# Patient Record
Sex: Male | Born: 1990 | Race: Black or African American | Hispanic: No | Marital: Single | Smoking: Current some day smoker
Health system: Southern US, Community
[De-identification: ages and names within clinical notes are randomized; demographics above are authoritative.]

## PROBLEM LIST (undated history)

## (undated) DIAGNOSIS — R7303 Prediabetes: Secondary | ICD-10-CM

## (undated) DIAGNOSIS — I10 Essential (primary) hypertension: Secondary | ICD-10-CM

## (undated) HISTORY — PX: APPENDECTOMY: SHX54

---
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
## 2013-04-01 ENCOUNTER — Emergency Department (HOSPITAL_BASED_OUTPATIENT_CLINIC_OR_DEPARTMENT_OTHER): Payer: Medicaid Other

## 2013-04-01 ENCOUNTER — Emergency Department (HOSPITAL_BASED_OUTPATIENT_CLINIC_OR_DEPARTMENT_OTHER)
Admission: EM | Admit: 2013-04-01 | Discharge: 2013-04-01 | Disposition: A | Discharge status: Home / Self Care | Payer: Medicaid Other | Attending: Emergency Medicine | Admitting: Emergency Medicine

## 2013-04-01 DIAGNOSIS — S93402A Sprain of unspecified ligament of left ankle, initial encounter: Secondary | ICD-10-CM

## 2013-04-01 DIAGNOSIS — Y990 Civilian activity done for income or pay: Secondary | ICD-10-CM | POA: Insufficient documentation

## 2013-04-01 DIAGNOSIS — X500XXA Overexertion from strenuous movement or load, initial encounter: Secondary | ICD-10-CM | POA: Insufficient documentation

## 2013-04-01 DIAGNOSIS — S93409A Sprain of unspecified ligament of unspecified ankle, initial encounter: Secondary | ICD-10-CM | POA: Insufficient documentation

## 2013-04-01 DIAGNOSIS — Y9289 Other specified places as the place of occurrence of the external cause: Secondary | ICD-10-CM | POA: Insufficient documentation

## 2013-04-01 DIAGNOSIS — Y939 Activity, unspecified: Secondary | ICD-10-CM | POA: Insufficient documentation

## 2013-04-01 MED ORDER — IBUPROFEN 800 MG PO TABS: 800.0000 mg | ORAL_TABLET | Freq: Three times a day (TID) | ORAL | Status: DC

## 2013-04-01 NOTE — Discharge Instructions (Signed)

## 2013-04-01 NOTE — ED Provider Notes (Signed)
Medical screening examination/treatment/procedure(s) were performed by non-physician practitioner and as supervising physician I was immediately available for consultation/collaboration.   EKG Interpretation None        Brianne Maina T Verlene Glantz, MD 04/01/13 1623 

## 2013-04-01 NOTE — ED Notes (Signed)
States twisted right ankle last week and re aggravated it at work today

## 2013-04-01 NOTE — ED Provider Notes (Signed)
CSN: [id number]     Arrival date & time 04/01/13  1202 History   First MD Initiated Contact with Patient 04/01/13 1240     Chief Complaint  Patient presents with  . Ankle Injury     (Consider location/radiation/quality/duration/timing/severity/associated sxs/prior Treatment) Patient is a 23 y.o. male presenting with ankle pain. The history is provided by the patient. No language interpreter was used.  Ankle Pain Location:  Ankle Time since incident:  1 week Injury: no   Ankle location:  R ankle Pain details:    Quality:  Aching   Radiates to:  Does not radiate   Severity:  Mild   Onset quality:  Gradual   Duration:  1 week   Timing:  Intermittent Chronicity:  New Worsened by:  Nothing tried Ineffective treatments:  None tried Pt turned ankle 1 week ago and then again today.  Pt wearing a brace.    No past medical history on file. Past Surgical History  Procedure Laterality Date  . Appendectomy     No family history on file. History  Substance Use Topics  . Smoking status: Never Smoker   . Smokeless tobacco: Not on file  . Alcohol Use: No    Review of Systems  Musculoskeletal: Positive for joint swelling and myalgias.  All other systems reviewed and are negative.      Allergies  Review of patient's allergies indicates no known allergies.  Home Medications  No current outpatient prescriptions on file. BP 132/81  Pulse 62  Temp(Src) 98.3 F (36.8 C) (Oral)  Resp 18  Ht 5\' 5"  (1.651 m)  Wt 130 lb (58.968 kg)  BMI 21.63 kg/m2  SpO2 99% Physical Exam  Constitutional: He is oriented to person, place, and time. He appears well-developed and well-nourished.  Musculoskeletal: He exhibits tenderness.  Swollen right ankle  Decreased range of motion,  nv and ns intact  Neurological: He is alert and oriented to person, place, and time. He has normal reflexes.  Skin: Skin is warm.  Psychiatric: He has a normal mood and affect.    ED Course  Procedures  (including critical care time) Labs Review Labs Reviewed - No data to display Imaging Review No results found.   EKG Interpretation None      MDM xray no fracture   Final diagnoses:  Sprain of left ankle    Pt in a brace,   Pt advised wear brace,   Rx for ibuprofen,      Elson AreasLeslie K Sharronda Schweers, PA-C 04/01/13 [id number]1418  Braidan Ricciardi K [place], [place] JerseyPA-C 04/01/13 1418

## 2013-04-01 NOTE — ED Notes (Signed)
To clarify : pt states last Thursday he hurt left ankle was seen at [facility] diagnosed with sprain today he has injured right ankle

## 2013-08-08 ENCOUNTER — Encounter (HOSPITAL_BASED_OUTPATIENT_CLINIC_OR_DEPARTMENT_OTHER): Payer: Self-pay | Admitting: Emergency Medicine

## 2013-08-08 ENCOUNTER — Emergency Department (HOSPITAL_BASED_OUTPATIENT_CLINIC_OR_DEPARTMENT_OTHER)
Admission: EM | Admit: 2013-08-08 | Discharge: 2013-08-08 | Disposition: A | Discharge status: Home / Self Care | Payer: Medicaid Other | Attending: Emergency Medicine | Admitting: Emergency Medicine

## 2013-08-08 ENCOUNTER — Emergency Department (HOSPITAL_BASED_OUTPATIENT_CLINIC_OR_DEPARTMENT_OTHER): Payer: Medicaid Other

## 2013-08-08 DIAGNOSIS — R079 Chest pain, unspecified: Secondary | ICD-10-CM

## 2013-08-08 MED ORDER — IBUPROFEN 800 MG PO TABS: 800.0000 mg | ORAL_TABLET | Freq: Three times a day (TID) | ORAL | Status: DC

## 2013-08-08 NOTE — ED Notes (Signed)
Sharp left sided chest pain. Hx of same and was never diagnosed with a cause.

## 2013-08-08 NOTE — Discharge Instructions (Signed)

## 2013-08-08 NOTE — ED Provider Notes (Signed)
CSN: [id number]     Arrival date & time 08/08/13  1349 History   First MD Initiated Contact with Patient 08/08/13 1407     Chief Complaint  Patient presents with  . Chest Pain     (Consider location/radiation/quality/duration/timing/severity/associated sxs/prior Treatment) Patient is a 23 y.o. male presenting with chest pain. The history is provided by the patient. No language interpreter was used.  Chest Pain Pain location:  L chest Pain quality: sharp and stabbing   Pain radiates to:  Does not radiate Pain radiates to the back: no   Pain severity:  No pain Onset quality:  Unable to specify Duration:  1 minute Timing:  Constant Progression:  Worsening Chronicity:  New Context: breathing and movement   Relieved by:  Nothing Worsened by:  Deep breathing Ineffective treatments:  None tried Risk factors: no diabetes mellitus, no high cholesterol and no hypertension     History reviewed. No pertinent past medical history. Past Surgical History  Procedure Laterality Date  . Appendectomy     No family history on file. History  Substance Use Topics  . Smoking status: Never Smoker   . Smokeless tobacco: Not on file  . Alcohol Use: No    Review of Systems  Cardiovascular: Positive for chest pain.  All other systems reviewed and are negative.     Allergies  Review of patient's allergies indicates no known allergies.  Home Medications   Prior to Admission medications   Medication Sig Start Date End Date Taking? Authorizing Provider  ibuprofen (ADVIL,MOTRIN) 800 MG tablet Take 1 tablet (800 mg total) by mouth 3 (three) times daily. 04/01/13   Elson AreasLeslie K Amaryllis Malmquist, PA-C   BP 134/60  Pulse 95  Temp(Src) 98.6 F (37 C) (Oral)  Resp 18  Ht 5\' 5"  (1.651 m)  Wt 130 lb (58.968 kg)  BMI 21.63 kg/m2  SpO2 98% Physical Exam  Nursing note and vitals reviewed. Constitutional: He is oriented to person, place, and time. He appears well-developed and well-nourished.  HENT:  Head:  Normocephalic.  Right Ear: External ear normal.  Left Ear: External ear normal.  Nose: Nose normal.  Mouth/Throat: Oropharynx is clear and moist.  Eyes: Conjunctivae and EOM are normal. Pupils are equal, round, and reactive to light.  Neck: Normal range of motion.  Pulmonary/Chest: Effort normal.  Abdominal: Soft. He exhibits no distension.  Musculoskeletal: Normal range of motion.  Neurological: He is alert and oriented to person, place, and time.  Skin: Skin is warm.  Psychiatric: He has a normal mood and affect.    ED Course  Procedures (including critical care time) Labs Review Labs Reviewed - No data to display  Imaging Review Dg Chest 2 View  08/08/2013   CLINICAL DATA:  Chest pain for 3-4 days.  EXAM: CHEST  2 VIEW  COMPARISON:  PA and lateral chest 12/12/2010.  FINDINGS: Heart size and mediastinal contours are within normal limits. Both lungs are clear. Visualized skeletal structures are unremarkable.  IMPRESSION: Negative exam.   Electronically Signed   By: Drusilla Kannerhomas  Dalessio M.D.   On: 08/08/2013 14:33     EKG Interpretation None      MDM EKG is normal,   Chest xray normal,    I doubt cardiac etiology.   Pt advised ibuprofen as needed.  Resource guide given   Final diagnoses:  Nonspecific chest pain        Elson AreasLeslie K Chidi Shirer, [place]-C 08/08/13 1510

## 2013-08-09 NOTE — ED Provider Notes (Signed)
Medical screening examination/treatment/procedure(s) were conducted as a shared visit with non-physician practitioner(s) or resident  and myself.  I personally evaluated the patient during the encounter and agree with the findings.   I have personally reviewed any xrays and/ or EKG's with the provider and I agree with interpretation.   Healthy male except for cigarette smoking presents with intermittent brief left sharp chest pain without radiation. No pleuritic or exertional component. No syncope with it. No known cardiac problems, no recent blood clot history, no leg swelling or leg pain. Patient has no symptoms currently. well-appearing, regular heart rate and rhythm, lungs clear. Very low-risk chest pain likely musculoskeletal   EKG Interpretation   Date/Time:  Thursday August 08 2013 14:07:43 EDT Ventricular Rate:  70 PR Interval:  122 QRS Duration: 82 QT Interval:  368 QTC Calculation: 397 R Axis:   85 Text Interpretation:  Normal sinus rhythm with sinus arrhythmia Normal ECG  Biphasic T wave V3 Confirmed by Jodi MourningZAVITZ  MD, Markel Mergenthaler ([id number]) on 08/08/2013  3:10:30 PM        Barry SkeensJoshua M Bular Hickok, MD 08/09/13 [id number]

## 2014-06-23 ENCOUNTER — Encounter (HOSPITAL_BASED_OUTPATIENT_CLINIC_OR_DEPARTMENT_OTHER): Payer: Self-pay

## 2014-06-23 ENCOUNTER — Emergency Department (HOSPITAL_BASED_OUTPATIENT_CLINIC_OR_DEPARTMENT_OTHER)
Admission: EM | Admit: 2014-06-23 | Discharge: 2014-06-23 | Disposition: A | Discharge status: Home / Self Care | Payer: Medicaid Other | Attending: Emergency Medicine | Admitting: Emergency Medicine

## 2014-06-23 DIAGNOSIS — M6281 Muscle weakness (generalized): Secondary | ICD-10-CM | POA: Insufficient documentation

## 2014-06-23 DIAGNOSIS — R531 Weakness: Secondary | ICD-10-CM

## 2014-06-23 NOTE — ED Notes (Signed)
C/o "feeling weak" unable to left laundry basket x today-steady fast pace gait into triage-NAD

## 2014-06-23 NOTE — ED Notes (Signed)
C/o weakness and ha x 2 days  Denies n/v,  No cough or congestion

## 2014-06-23 NOTE — Discharge Instructions (Signed)
Fatigue °Fatigue is a feeling of tiredness, lack of energy, lack of motivation, or feeling tired all the time. Having enough rest, good nutrition, and reducing stress will normally reduce fatigue. Consult your caregiver if it persists. The nature of your fatigue will help your caregiver to find out its cause. The treatment is based on the cause.  °CAUSES  °There are many causes for fatigue. Most of the time, fatigue can be traced to one or more of your habits or routines. Most causes fit into one or more of three general areas. They are: °Lifestyle problems °· Sleep disturbances. °· Overwork. °· Physical exertion. °· Unhealthy habits. °· Poor eating habits or eating disorders. °· Alcohol and/or drug use . °· Lack of proper nutrition (malnutrition). °Psychological problems °· Stress and/or anxiety problems. °· Depression. °· Grief. °· Boredom. °Medical Problems or Conditions °· Anemia. °· Pregnancy. °· Thyroid gland problems. °· Recovery from major surgery. °· Continuous pain. °· Emphysema or asthma that is not well controlled °· Allergic conditions. °· Diabetes. °· Infections (such as mononucleosis). °· Obesity. °· Sleep disorders, such as sleep apnea. °· Heart failure or other heart-related problems. °· Cancer. °· Kidney disease. °· Liver disease. °· Effects of certain medicines such as antihistamines, cough and cold remedies, prescription pain medicines, heart and blood pressure medicines, drugs used for treatment of cancer, and some antidepressants. °SYMPTOMS  °The symptoms of fatigue include:  °· Lack of energy. °· Lack of drive (motivation). °· Drowsiness. °· Feeling of indifference to the surroundings. °DIAGNOSIS  °The details of how you feel help guide your caregiver in finding out what is causing the fatigue. You will be asked about your present and past health condition. It is important to review all medicines that you take, including prescription and non-prescription items. A thorough exam will be done.  You will be questioned about your feelings, habits, and normal lifestyle. Your caregiver may suggest blood tests, urine tests, or other tests to look for common medical causes of fatigue.  °TREATMENT  °Fatigue is treated by correcting the underlying cause. For example, if you have continuous pain or depression, treating these causes will improve how you feel. Similarly, adjusting the dose of certain medicines will help in reducing fatigue.  °HOME CARE INSTRUCTIONS  °· Try to get the required amount of good sleep every night. °· Eat a healthy and nutritious diet, and drink enough water throughout the day. °· Practice ways of relaxing (including yoga or meditation). °· Exercise regularly. °· Make plans to change situations that cause stress. Act on those plans so that stresses decrease over time. Keep your work and personal routine reasonable. °· Avoid street drugs and minimize use of alcohol. °· Start taking a daily multivitamin after consulting your caregiver. °SEEK MEDICAL CARE IF:  °· You have persistent tiredness, which cannot be accounted for. °· You have fever. °· You have unintentional weight loss. °· You have headaches. °· You have disturbed sleep throughout the night. °· You are feeling sad. °· You have constipation. °· You have dry skin. °· You have gained weight. °· You are taking any new or different medicines that you suspect are causing fatigue. °· You are unable to sleep at night. °· You develop any unusual swelling of your legs or other parts of your body. °SEEK IMMEDIATE MEDICAL CARE IF:  °· You are feeling confused. °· Your vision is blurred. °· You feel faint or pass out. °· You develop severe headache. °· You develop severe abdominal, pelvic, or   back pain. °· You develop chest pain, shortness of breath, or an irregular or fast heartbeat. °· You are unable to pass a normal amount of urine. °· You develop abnormal bleeding such as bleeding from the rectum or you vomit blood. °· You have thoughts  about harming yourself or committing suicide. °· You are worried that you might harm someone else. °MAKE SURE YOU:  °· Understand these instructions. °· Will watch your condition. °· Will get help right away if you are not doing well or get worse. °Document Released: 11/07/2006 Document Revised: 04/04/2011 Document Reviewed: 05/14/2013 °ExitCare® Patient Information ©2015 [employer]. This information is not intended to replace advice given to you by your health care provider. Make sure you discuss any questions you have with your health care provider. °Weakness °Weakness is a lack of strength. It may be felt all over the body (generalized) or in one specific part of the body (focal). Some causes of weakness can be serious. You may need further medical evaluation, especially if you are elderly or you have a history of immunosuppression (such as chemotherapy or HIV), kidney disease, heart disease, or diabetes. °CAUSES  °Weakness can be caused by many different things, including: °· Infection. °· Physical exhaustion. °· Internal bleeding or other blood loss that results in a lack of red blood cells (anemia). °· Dehydration. This cause is more common in elderly people. °· Side effects or electrolyte abnormalities from medicines, such as pain medicines or sedatives. °· Emotional distress, anxiety, or depression. °· Circulation problems, especially severe peripheral arterial disease. °· Heart disease, such as rapid atrial fibrillation, bradycardia, or heart failure. °· Nervous system disorders, such as Guillain-Barré syndrome, multiple sclerosis, or stroke. °DIAGNOSIS  °To find the cause of your weakness, your caregiver will take your history and perform a physical exam. Lab tests or X-rays may also be ordered, if needed. °TREATMENT  °Treatment of weakness depends on the cause of your symptoms and can vary greatly. °HOME CARE INSTRUCTIONS  °· Rest as needed. °· Eat a well-balanced diet. °· Try to get some exercise  every day. °· Only take over-the-counter or prescription medicines as directed by your caregiver. °SEEK MEDICAL CARE IF:  °· Your weakness seems to be getting worse or spreads to other parts of your body. °· You develop new aches or pains. °SEEK IMMEDIATE MEDICAL CARE IF:  °· You cannot perform your normal daily activities, such as getting dressed and feeding yourself. °· You cannot walk up and down stairs, or you feel exhausted when you do so. °· You have shortness of breath or chest pain. °· You have difficulty moving parts of your body. °· You have weakness in only one area of the body or on only one side of the body. °· You have a fever. °· You have trouble speaking or swallowing. °· You cannot control your bladder or bowel movements. °· You have black or bloody vomit or stools. °MAKE SURE YOU: °· Understand these instructions. °· Will watch your condition. °· Will get help right away if you are not doing well or get worse. °Document Released: 01/10/2005 Document Revised: 07/12/2011 Document Reviewed: 03/11/2011 °ExitCare® Patient Information ©2015 [employer]. This information is not intended to replace advice given to you by your health care provider. Make sure you discuss any questions you have with your health care provider. ° °

## 2014-06-23 NOTE — ED Provider Notes (Signed)
CSN: [id number]     Arrival date & time 06/23/14  1912 History   First MD Initiated Contact with Patient 06/23/14 1922     Chief Complaint  Patient presents with  . Fatigue     (Consider location/radiation/quality/duration/timing/severity/associated sxs/prior Treatment) HPI Comments: 24 year old male complaining of generalized weakness 2 days. States he was trying to lift up a laundry basket today, and was unable to do so with his normal strength which is why he came to the emergency department. States he needs to go to work tomorrow, and at work he lifts heavy boxes, and he does not want to go to work tomorrow if he cannot lift anything. Denies 1 extremity being more week and then another, fever, chills, nausea, vomiting, diarrhea, cough, shortness of breath, wheezing, vision change, dizziness or lightheadedness. Reports a mild, generalized headache. States this has happened in the past and he needed to rest.  The history is provided by the patient.    History reviewed. No pertinent past medical history. Past Surgical History  Procedure Laterality Date  . Appendectomy     No family history on file. History  Substance Use Topics  . Smoking status: Never Smoker   . Smokeless tobacco: Not on file  . Alcohol Use: No    Review of Systems  10 Systems reviewed and are negative for acute change except as noted in the HPI.  Allergies  Review of patient's allergies indicates no known allergies.  Home Medications   Prior to Admission medications   Not on File   BP 118/84 mmHg  Pulse 56  Temp(Src) 97.7 F (36.5 C) (Oral)  Resp 16  Ht  (1.651 m)  Wt 130 lb (58.968 kg)  BMI 21.63 kg/m2  SpO2 99% Physical Exam  Constitutional: He is oriented to person, place, and time. He appears well-developed and well-nourished. No distress.  HENT:  Head: Normocephalic and atraumatic.  Mouth/Throat: Oropharynx is clear and moist.  Eyes: Conjunctivae and EOM are normal. Pupils are  equal, round, and reactive to light.  Neck: Normal range of motion. Neck supple.  Cardiovascular: Normal rate, regular rhythm, normal heart sounds and intact distal pulses.   Pulmonary/Chest: Effort normal and breath sounds normal. No respiratory distress.  Abdominal: Soft. Bowel sounds are normal. There is no tenderness.  Musculoskeletal: Normal range of motion. He exhibits no edema.  Neurological: He is alert and oriented to person, place, and time. He has normal strength. No cranial nerve deficit or sensory deficit. He displays a negative Romberg sign. Coordination normal.  Strength 5/5 BL upper and lower extremities and equal BL. Speech fluent, goal oriented. Moves limbs without ataxia. Normal gait.  Skin: Skin is warm and dry. He is not diaphoretic.  Psychiatric: He has a normal mood and affect. His behavior is normal.  Nursing note and vitals reviewed.   ED Course  Procedures (including critical care time) Labs Review Labs Reviewed - No data to display  Imaging Review No results found.   EKG Interpretation None      MDM   Final diagnoses:  Generalized weakness   Nontoxic appearing, NAD. AF VSS. Strength on my exam 5/5 bilateral upper and lower extremities and equal bilateral. No focal neurologic deficits. Doubt CVA, SAH, ICH. No other symptoms present on any mild headache. I advised him to rest tonight, and I do not have any findings to keep him out of work tomorrow. Stable for discharge. Return precautions given. Patient states understanding of treatment care plan and is  agreeable.  Kathrynn SpeedRobyn M Adelin Ventrella, PA-C 06/23/14 1943  Margarita Grizzleanielle Ray, MD 06/24/14 1256

## 2014-10-16 ENCOUNTER — Emergency Department (HOSPITAL_BASED_OUTPATIENT_CLINIC_OR_DEPARTMENT_OTHER): Payer: Medicaid Other

## 2014-10-16 ENCOUNTER — Emergency Department (HOSPITAL_BASED_OUTPATIENT_CLINIC_OR_DEPARTMENT_OTHER)
Admission: EM | Admit: 2014-10-16 | Discharge: 2014-10-16 | Disposition: A | Discharge status: Home / Self Care | Payer: Medicaid Other | Attending: Emergency Medicine | Admitting: Emergency Medicine

## 2014-10-16 ENCOUNTER — Encounter (HOSPITAL_BASED_OUTPATIENT_CLINIC_OR_DEPARTMENT_OTHER): Payer: Self-pay | Admitting: *Deleted

## 2014-10-16 DIAGNOSIS — R51 Headache: Secondary | ICD-10-CM | POA: Insufficient documentation

## 2014-10-16 DIAGNOSIS — R519 Headache, unspecified: Secondary | ICD-10-CM

## 2014-10-16 MED ORDER — IBUPROFEN 800 MG PO TABS
800.0000 mg | ORAL_TABLET | Freq: Once | ORAL | Status: AC
  Administered 2014-10-16: 800 mg via ORAL
  Filled 2014-10-16: qty 1

## 2014-10-16 NOTE — ED Provider Notes (Signed)
CSN: [id number]     Arrival date & time 10/16/14  1433 History   First MD Initiated Contact with Patient 10/16/14 1453     Chief Complaint  Patient presents with  . Headache     (Consider location/radiation/quality/duration/timing/severity/associated sxs/prior Treatment) HPI Comments: Patient is a 24 year old male with no significant past medical history. He presents for evaluation of headache. He states that he has a dull throbbing pain to the front of his head behind his eyes. His pain is worsened when he moves his eyes. He denies any fevers or chills. He denies any sinus congestion. He denies any injury or trauma.  Patient is a 24 y.o. male presenting with headaches. The history is provided by the patient.  Headache Pain location:  Frontal Quality:  Dull Radiates to:  Does not radiate Onset quality:  Sudden Duration:  2 hours Timing:  Constant Progression:  Unchanged Chronicity:  New Similar to prior headaches: no   Context: not exposure to bright light and not loud noise   Relieved by:  Nothing Worsened by:  Nothing   History reviewed. No pertinent past medical history. Past Surgical History  Procedure Laterality Date  . Appendectomy     No family history on file. Social History  Substance Use Topics  . Smoking status: Never Smoker   . Smokeless tobacco: None  . Alcohol Use: No    Review of Systems  Neurological: Positive for headaches.  All other systems reviewed and are negative.     Allergies  Review of patient's allergies indicates no known allergies.  Home Medications   Prior to Admission medications   Not on File   BP 108/48 mmHg  Pulse 77  Temp(Src) 97.8 F (36.6 C) (Oral)  Resp 18  Ht  (1.676 m)  Wt 130 lb (58.968 kg)  BMI 20.99 kg/m2  SpO2 98% Physical Exam  Constitutional: He is oriented to person, place, and time. He appears well-developed and well-nourished. No distress.  HENT:  Head: Normocephalic and atraumatic.   Mouth/Throat: Oropharynx is clear and moist.  Eyes: EOM are normal. Pupils are equal, round, and reactive to light.  There is no papilledema on funduscopic exam.  Neck: Normal range of motion. Neck supple.  Cardiovascular: Normal rate, regular rhythm and normal heart sounds.   No murmur heard. Pulmonary/Chest: Effort normal and breath sounds normal. No respiratory distress. He has no wheezes.  Musculoskeletal: Normal range of motion. He exhibits no edema.  Lymphadenopathy:    He has no cervical adenopathy.  Neurological: He is alert and oriented to person, place, and time. No cranial nerve deficit. He exhibits normal muscle tone. Coordination normal.  Skin: Skin is warm and dry. He is not diaphoretic.  Nursing note and vitals reviewed.   ED Course  Procedures (including critical care time) Labs Review Labs Reviewed - No data to display  Imaging Review No results found. I have personally reviewed and evaluated these images and lab results as part of my medical decision-making.   EKG Interpretation None      MDM   Final diagnoses:  None    CT scan of the head is unremarkable and neurologic exam is nonfocal. I will recommend Tylenol rotated with Motrin as needed for his headache. I see no indication for LP or further imaging.    Geoffery Lyons, MD 10/16/14 1524

## 2014-10-16 NOTE — ED Notes (Signed)
Headache while at work today. Headache 2 weeks ago that went away after a couple of hours.

## 2014-10-16 NOTE — Discharge Instructions (Signed)
Tylenol 1000 mg rotated with Motrin 600 mg every 4 hours as needed for pain.  Return to the ER symptoms significantly worsen or change.   General Headache Without Cause A general headache is pain or discomfort felt around the head or neck area. The cause may not be found.  HOME CARE   Keep all doctor visits.  Only take medicines as told by your doctor.  Lie down in a dark, quiet room when you have a headache.  Keep a journal to find out if certain things bring on headaches. For example, write down:  What you eat and drink.  How much sleep you get.  Any change to your diet or medicines.  Relax by getting a massage or doing other relaxing activities.  Put ice or heat packs on the head and neck area as told by your doctor.  Lessen stress.  Sit up straight. Do not tighten (tense) your muscles.  Quit smoking if you smoke.  Lessen how much alcohol you drink.  Lessen how much caffeine you drink, or stop drinking caffeine.  Eat and sleep on a regular schedule.  Get 7 to 9 hours of sleep, or as told by your doctor.  Keep lights dim if bright lights bother you or make your headaches worse. GET HELP RIGHT AWAY IF:   Your headache becomes really bad.  You have a fever.  You have a stiff neck.  You have trouble seeing.  Your muscles are weak, or you lose muscle control.  You lose your balance or have trouble walking.  You feel like you will pass out (faint), or you pass out.  You have really bad symptoms that are different than your first symptoms.  You have problems with the medicines given to you by your doctor.  Your medicines do not work.  Your headache feels different than the other headaches.  You feel sick to your stomach (nauseous) or throw up (vomit). MAKE SURE YOU:   Understand these instructions.  Will watch your condition.  Will get help right away if you are not doing well or get worse. Document Released: 10/20/2007 Document Revised:  04/04/2011 Document Reviewed: 12/31/2010 [facility] Patient Information 2015 [place], [place]. This information is not intended to replace advice given to you by your health care provider. Make sure you discuss any questions you have with your health care provider.

## 2014-10-16 NOTE — ED Notes (Addendum)
Pt states he has had a headache since 1pm today. States the headache is "all over". Denies being sensitive to light and sound. States movement hurts. Pt states that this is the 2nd time this headache has happened. Pt states that he "tried to sleep it off." Pt states the last time he had a headache like this was 2 weeks ago.

## 2015-07-29 ENCOUNTER — Emergency Department (HOSPITAL_BASED_OUTPATIENT_CLINIC_OR_DEPARTMENT_OTHER)
Admission: EM | Admit: 2015-07-29 | Discharge: 2015-07-29 | Disposition: A | Discharge status: Home / Self Care | Payer: Self-pay | Attending: Emergency Medicine | Admitting: Emergency Medicine

## 2015-07-29 ENCOUNTER — Encounter (HOSPITAL_BASED_OUTPATIENT_CLINIC_OR_DEPARTMENT_OTHER): Payer: Self-pay

## 2015-07-29 ENCOUNTER — Emergency Department (HOSPITAL_BASED_OUTPATIENT_CLINIC_OR_DEPARTMENT_OTHER): Payer: Self-pay

## 2015-07-29 DIAGNOSIS — F17210 Nicotine dependence, cigarettes, uncomplicated: Secondary | ICD-10-CM | POA: Insufficient documentation

## 2015-07-29 DIAGNOSIS — R0789 Other chest pain: Secondary | ICD-10-CM | POA: Insufficient documentation

## 2015-07-29 HISTORY — DX: Prediabetes: R73.03

## 2015-07-29 LAB — CBC WITH DIFFERENTIAL/PLATELET
BASOS ABS: 0 10*3/uL (ref 0.0–0.1)
BASOS PCT: 0 %
Eosinophils Absolute: 0.1 10*3/uL (ref 0.0–0.7)
Eosinophils Relative: 1 %
HEMATOCRIT: 46.3 % (ref 39.0–52.0)
HEMOGLOBIN: 16.4 g/dL (ref 13.0–17.0)
Lymphocytes Relative: 23 %
Lymphs Abs: 2.6 10*3/uL (ref 0.7–4.0)
MCH: 30.3 pg (ref 26.0–34.0)
MCHC: 35.4 g/dL (ref 30.0–36.0)
MCV: 85.6 fL (ref 78.0–100.0)
MONO ABS: 0.8 10*3/uL (ref 0.1–1.0)
Monocytes Relative: 8 %
NEUTROS ABS: 7.4 10*3/uL (ref 1.7–7.7)
NEUTROS PCT: 68 %
Platelets: 250 10*3/uL (ref 150–400)
RBC: 5.41 MIL/uL (ref 4.22–5.81)
RDW: 12.9 % (ref 11.5–15.5)
WBC: 11 10*3/uL — ABNORMAL HIGH (ref 4.0–10.5)

## 2015-07-29 LAB — BASIC METABOLIC PANEL
ANION GAP: 8 (ref 5–15)
BUN: 13 mg/dL (ref 6–20)
CALCIUM: 9.1 mg/dL (ref 8.9–10.3)
CO2: 24 mmol/L (ref 22–32)
Chloride: 105 mmol/L (ref 101–111)
Creatinine, Ser: 0.95 mg/dL (ref 0.61–1.24)
Glucose, Bld: 102 mg/dL — ABNORMAL HIGH (ref 65–99)
Potassium: 4.4 mmol/L (ref 3.5–5.1)
SODIUM: 137 mmol/L (ref 135–145)

## 2015-07-29 LAB — TROPONIN I

## 2015-07-29 NOTE — Discharge Instructions (Signed)
Your chest x-ray is normal. The remainder of your blood work is unremarkable.  Quit smoking as it may contribute to your pain  Return for worsening symptoms, including difficulty breathing, worsening pain, passing out or any other symptoms concerning to you.  Nonspecific Chest Pain It is often hard to find the cause of chest pain. There is always a chance that your pain could be related to something serious, such as a heart attack or a blood clot in your lungs. Chest pain can also be caused by conditions that are not life-threatening. If you have chest pain, it is very important to follow up with your doctor.  HOME CARE  If you were prescribed an antibiotic medicine, finish it all even if you start to feel better.  Avoid any activities that cause chest pain.  Do not use any tobacco products, including cigarettes, chewing tobacco, or electronic cigarettes. If you need help quitting, ask your doctor.  Do not drink alcohol.  Take medicines only as told by your doctor.  Keep all follow-up visits as told by your doctor. This is important. This includes any further testing if your chest pain does not go away.  Your doctor may tell you to keep your head raised (elevated) while you sleep.  Make lifestyle changes as told by your doctor. These may include:  Getting regular exercise. Ask your doctor to suggest some activities that are safe for you.  Eating a heart-healthy diet. Your doctor or a diet specialist (dietitian) can help you to learn healthy eating options.  Maintaining a healthy weight.  Managing diabetes, if necessary.  Reducing stress. GET HELP IF:  Your chest pain does not go away, even after treatment.  You have a rash with blisters on your chest.  You have a fever. GET HELP RIGHT AWAY IF:  Your chest pain is worse.  You have an increasing cough, or you cough up blood.  You have severe belly (abdominal) pain.  You feel extremely weak.  You pass out  (faint).  You have chills.  You have sudden, unexplained chest discomfort.  You have sudden, unexplained discomfort in your arms, back, neck, or jaw.  You have shortness of breath at any time.  You suddenly start to sweat, or your skin gets clammy.  You feel nauseous.  You vomit.  You suddenly feel light-headed or dizzy.  Your heart begins to beat quickly, or it feels like it is skipping beats. These symptoms may be an emergency. Do not wait to see if the symptoms will go away. Get medical help right away. Call your local emergency services (911 in the [place] Do not drive yourself to the hospital.   This information is not intended to replace advice given to you by your health care provider. Make sure you discuss any questions you have with your health care provider.   Document Released: 06/29/2007 Document Revised: 01/31/2014 Document Reviewed: 08/16/2013 Elsevier Interactive Patient Education [employer]2016 [employer].

## 2015-07-29 NOTE — ED Provider Notes (Signed)
CSN: [id number]     Arrival date & time 07/29/15  1141 History   First MD Initiated Contact with Patient 07/29/15 1156     Chief Complaint  Patient presents with  . Chest Pain     (Consider location/radiation/quality/duration/timing/severity/associated sxs/prior Treatment) HPI  25 year old who presents with chest pain. States 2-3 days with on and off chest pain on his left chest. States that he has had chest pains before, but they improved for a while and now recurrent with frequent episodes. States he has a few seconds to minutes of sharp pain over the left side. Smoked black and mild 2 days ago, preceding pain but unsure if that caused it. No aggravating or alleviating factors. No factors that bring it on such a eating, activity or exertion or movement. Does think symptoms are worse when he takes a deep breath in. Does not feel dyspnea, lightheadedness or syncope. No LE edema or calf tenderness. No personal history of PE/DVT or family history. No recent surgery. NO recent fevers, chills, cough, cold or flu symptoms.   Past Medical History  Diagnosis Date  . Prediabetes    Past Surgical History  Procedure Laterality Date  . Appendectomy     No family history on file. Social History  Substance Use Topics  . Smoking status: Current Some Day Smoker    Types: Cigars  . Smokeless tobacco: None  . Alcohol Use: No    Review of Systems 10/14 systems reviewed and are negative other than those stated in the HPI   Allergies  Review of patient's allergies indicates no known allergies.  Home Medications   Prior to Admission medications   Not on File   BP 138/81 mmHg  Pulse 60  Temp(Src) 98.6 F (37 C) (Oral)  Resp 18  Ht 5\' 5"  (1.651 m)  Wt 135 lb (61.236 kg)  BMI 22.47 kg/m2  SpO2 100% Physical Exam Physical Exam  Nursing note and vitals reviewed. Constitutional: Well developed, well nourished, non-toxic, and in no acute distress Head: Normocephalic and atraumatic.   Mouth/Throat: Oropharynx is clear and moist.  Neck: Normal range of motion. Neck supple.  Cardiovascular: Normal rate and regular rhythm.  No LE edema or calf tenderness.  Pulmonary/Chest: Effort normal and breath sounds normal.  Abdominal: Soft. There is no tenderness. There is no rebound and no guarding.  Musculoskeletal: Normal range of motion.  Neurological: Alert, no facial droop, fluent speech, moves all extremities symmetrically Skin: Skin is warm and dry.  Psychiatric: Cooperative  ED Course  Procedures (including critical care time) Labs Review Labs Reviewed  CBC WITH DIFFERENTIAL/PLATELET - Abnormal; Notable for the following:    WBC 11.0 (*)    All other components within normal limits  BASIC METABOLIC PANEL - Abnormal; Notable for the following:    Glucose, Bld 102 (*)    All other components within normal limits  TROPONIN I    Imaging Review Dg Chest 2 View  07/29/2015  CLINICAL DATA:  Chest pain EXAM: CHEST  2 VIEW COMPARISON:  08/08/2013 FINDINGS: Normal heart size and mediastinal contours. No acute infiltrate or edema. No effusion or pneumothorax. Mild upper thoracic levo curvature. No acute osseous findings. IMPRESSION: No active cardiopulmonary disease. Electronically Signed   By: Marnee SpringJonathon  Watts M.D.   On: 07/29/2015 13:06   I have personally reviewed and evaluated these images and lab results as part of my medical decision-making.   EKG Interpretation   Date/Time:  Wednesday July 29 2015 11:54:37 EDT  Ventricular Rate:  54 PR Interval:    QRS Duration: 92 QT Interval:  416 QTC Calculation: 395 R Axis:   94 Text Interpretation:  Sinus rhythm Borderline right axis deviation ST  elev, probable normal early repol pattern Benign early repolarization No  significant change since last tracing Confirmed by Jeryl Umholtz MD, Yaire Kreher ([id number]) on  07/29/2015 11:59:48 AM      MDM   Final diagnoses:  Atypical chest pain   Presenting with 2-3 days of intermittent atypical  chest pain. Well-appearing and in no acute distress. Vital signs normal. He is asymptomatic. EKG with benign early repolarization but no other signs of heart strain or ischemia. Chest x-ray showing no cardiopulmonary processes. Basic blood work unremarkable.  PERC negative and low suspicion for PE. History not concerning for ACS and he is young and of low risk. No concern for PE or other serious intrathoracic or cardiopulmonary processes. Discussed smoking cessation as it can be contributing to symptoms. Strict return and follow-up instructions reviewed. He expressed understanding of all discharge instructions and felt comfortable with the plan of care.     Lavera Guiseana Duo Otila Starn, MD 07/29/15 [phone]

## 2015-07-29 NOTE — ED Notes (Signed)
CP x 2 days 

## 2015-09-01 ENCOUNTER — Encounter (HOSPITAL_BASED_OUTPATIENT_CLINIC_OR_DEPARTMENT_OTHER): Payer: Self-pay | Admitting: Emergency Medicine

## 2015-09-01 ENCOUNTER — Emergency Department (HOSPITAL_BASED_OUTPATIENT_CLINIC_OR_DEPARTMENT_OTHER)
Admission: EM | Admit: 2015-09-01 | Discharge: 2015-09-01 | Disposition: A | Discharge status: Home / Self Care | Payer: Medicaid Other | Attending: Emergency Medicine | Admitting: Emergency Medicine

## 2015-09-01 DIAGNOSIS — Y9361 Activity, american tackle football: Secondary | ICD-10-CM | POA: Insufficient documentation

## 2015-09-01 DIAGNOSIS — Y929 Unspecified place or not applicable: Secondary | ICD-10-CM | POA: Insufficient documentation

## 2015-09-01 DIAGNOSIS — S29012A Strain of muscle and tendon of back wall of thorax, initial encounter: Secondary | ICD-10-CM

## 2015-09-01 DIAGNOSIS — S39012A Strain of muscle, fascia and tendon of lower back, initial encounter: Secondary | ICD-10-CM | POA: Insufficient documentation

## 2015-09-01 DIAGNOSIS — W2101XA Struck by football, initial encounter: Secondary | ICD-10-CM | POA: Insufficient documentation

## 2015-09-01 DIAGNOSIS — Y990 Civilian activity done for income or pay: Secondary | ICD-10-CM | POA: Insufficient documentation

## 2015-09-01 DIAGNOSIS — Z87891 Personal history of nicotine dependence: Secondary | ICD-10-CM | POA: Insufficient documentation

## 2015-09-01 MED ORDER — NAPROXEN 500 MG PO TABS: 500.0000 mg | ORAL_TABLET | Freq: Two times a day (BID) | ORAL | 0 refills | Status: DC

## 2015-09-01 NOTE — ED Triage Notes (Signed)
Pt having right upper back/shoulder pain since Sunday after playing football.  Took ibuprofen yesterday with no relief.

## 2015-09-01 NOTE — ED Provider Notes (Signed)
[facility] Provider Note   CSN: [id number] Arrival date & time: 09/01/15  5621  First Provider Contact:  First MD Initiated Contact with Patient 09/01/15 1017        History   Chief Complaint Chief Complaint  Patient presents with  . Back Pain    HPI Barry Lester is a 25 y.o. male who presents with complaint of pain in the right shoulder girdle. Patient states that he was playing football 2 days ago, he fell a couple times during the game but then began having pain in the musculature of his back and shoulder region. He denies joint pain, but states he has worse pain with lifting his arms up over his head, putting on her taking off his shirt. He states that he went to work yesterday and took an 800 mg ibuprofen, which helped. Today he took the medications in his job was too much for him. 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 He left work because of pain. He denies numbness, tingling, weakness, history of injury to the same area.  HPI  Past Medical History:  Diagnosis Date  . Prediabetes     There are no active problems to display for this patient.   Past Surgical History:  Procedure Laterality Date  . APPENDECTOMY         Home Medications    Prior to Admission medications   Medication Sig Start Date End Date Taking? Authorizing Provider  naproxen (NAPROSYN) 500 MG tablet Take 1 tablet (500 mg total) by mouth 2 (two) times daily with a meal. 09/01/15   Arthor Captain, PA-C    Family History No family history on file.  Social History Social History  Substance Use Topics  . Smoking status: Former Smoker    Types: Cigars  . Smokeless tobacco: Never Used  . Alcohol use No     Allergies   Review of patient's allergies indicates no known allergies.   Review of Systems Review of Systems  Constitutional: Negative for chills and fever.  Musculoskeletal: Positive for myalgias. Negative for joint swelling.    Neurological: Negative for weakness.     Physical Exam Updated Vital Signs BP 117/63 (BP Location: Left Arm)   Pulse (!) 53   Temp 97.8 F (36.6 C) (Oral)   Resp 16   Ht  (1.676 m)   Wt 59 kg   SpO2 100%   BMI 20.98 kg/m   Physical Exam  Constitutional: He appears well-developed and well-nourished. No distress.  HENT:  Head: Normocephalic and atraumatic.  Eyes: Conjunctivae are normal. No scleral icterus.  Neck: Normal range of motion. Neck supple.  Cardiovascular: Normal rate, regular rhythm and normal heart sounds.   Pulmonary/Chest: Effort normal and breath sounds normal. No respiratory distress.  Abdominal: Soft. There is no tenderness.  Musculoskeletal: He exhibits no edema.       Right shoulder: He exhibits normal range of motion, no bony tenderness, no crepitus, no deformity and no spasm.       Thoracic back: He exhibits tenderness. He exhibits no bony tenderness, no edema, no deformity and no spasm.       Back:  Neurological: He is alert.  Skin: Skin is warm and dry. He is not diaphoretic.  Psychiatric: His behavior is normal.  Nursing note and vitals reviewed.    ED Treatments / Results  Labs (all labs ordered are listed, but only abnormal results are displayed) Labs Reviewed -  No data to display  EKG  EKG Interpretation None       Radiology No results found.  Procedures Procedures (including critical care time)  Medications Ordered in ED Medications - No data to display   Initial Impression / Assessment and Plan / ED Course  I have reviewed the triage vital signs and the nursing notes.  Pertinent labs & imaging results that were available during my care of the patient were reviewed by me and considered in my medical decision making (see chart for details).  Clinical Course    Patient with apparent muscle strain injury of the R lattisimus dorsi and R teres.  D/c with naproxen and cryotherapy. No concern for joint injury.  Discussed   Return precautions.  Final Clinical Impressions(s) / ED Diagnoses   Final diagnoses:  Strain of latissimus dorsi muscle, initial encounter    New Prescriptions New Prescriptions   NAPROXEN (NAPROSYN) 500 MG TABLET    Take 1 tablet (500 mg total) by mouth 2 (two) times daily with a meal.     Arthor Captainbigail Sofia Vanmeter, PA-C 09/01/15 1034    Charlynne Panderavid Hsienta Yao, MD 09/01/15 1517

## 2017-04-20 ENCOUNTER — Emergency Department (HOSPITAL_BASED_OUTPATIENT_CLINIC_OR_DEPARTMENT_OTHER)
Admission: EM | Admit: 2017-04-20 | Discharge: 2017-04-20 | Disposition: A | Discharge status: Home / Self Care | Payer: Self-pay | Attending: Emergency Medicine | Admitting: Emergency Medicine

## 2017-04-20 ENCOUNTER — Encounter (HOSPITAL_BASED_OUTPATIENT_CLINIC_OR_DEPARTMENT_OTHER): Payer: Self-pay | Admitting: *Deleted

## 2017-04-20 ENCOUNTER — Other Ambulatory Visit: Payer: Self-pay

## 2017-04-20 DIAGNOSIS — R220 Localized swelling, mass and lump, head: Secondary | ICD-10-CM | POA: Insufficient documentation

## 2017-04-20 DIAGNOSIS — Z87891 Personal history of nicotine dependence: Secondary | ICD-10-CM | POA: Insufficient documentation

## 2017-04-20 LAB — RAPID STREP SCREEN (MED CTR MEBANE ONLY): Streptococcus, Group A Screen (Direct): NEGATIVE

## 2017-04-20 NOTE — Discharge Instructions (Signed)
It is unclear what caused your symptoms.  You test is negative for strep pharyngitis.  It could be initial sign of viral respiratory tract infection (common cold).  Please come back to ED if your symptoms recurs or if you have other symptoms concerning to you.

## 2017-04-20 NOTE — ED Provider Notes (Signed)
[facility] EMERGENCY DEPARTMENT Provider Note   CSN: [id number] Arrival date & time: 04/20/17  1556     History   Chief Complaint Chief Complaint  Patient presents with  . Oral Swelling    HPI Barry Lester is a 27 y.o. male with no significant past medical history who presents with right-sided tongue swelling and discomfort with swallowing about 4 hours ago.  Patient was brushing his teeth when he felt right-sided tongue swelling and discomfort with swallowing.  Denies pain.  Denies recent illness.  Denies fevers or chills. He denies cough,  chest pain, dyspnea, reflux or heartburn. Currently his symptoms have resolved.  Denies new medicine or new food.  Denies smoking cigarettes, drinking alcohol or recreational drug use. HPI  Past Medical History:  Diagnosis Date  . Prediabetes     There are no active problems to display for this patient.   Past Surgical History:  Procedure Laterality Date  . APPENDECTOMY          Home Medications    Prior to Admission medications   Medication Sig Start Date End Date Taking? Authorizing Provider  naproxen (NAPROSYN) 500 MG tablet Take 1 tablet (500 mg total) by mouth 2 (two) times daily with a meal. 09/01/15   Arthor Captain, PA-C    Family History History reviewed. No pertinent family history.  Social History Social History   Tobacco Use  . Smoking status: Former Smoker    Types: Cigars  . Smokeless tobacco: Never Used  Substance Use Topics  . Alcohol use: No  . Drug use: No     Allergies   Patient has no known allergies.   Review of Systems Review of Systems  Constitutional: Negative for chills and fever.  HENT: Negative for congestion, ear pain, rhinorrhea, sore throat and trouble swallowing.   Eyes: Negative for pain and visual disturbance.  Respiratory: Negative for cough, choking, shortness of breath, wheezing and stridor.   Cardiovascular: Negative for chest pain and palpitations.    Gastrointestinal: Negative for abdominal pain, blood in stool, diarrhea, nausea and vomiting.  Genitourinary: Negative for dysuria and hematuria.  Musculoskeletal: Negative for arthralgias, back pain and joint swelling.  Skin: Negative for color change and rash.  Neurological: Negative for headaches.  Psychiatric/Behavioral: Negative for confusion.  All other systems reviewed and are negative.  Physical Exam Updated Vital Signs BP (!) 147/96 (BP Location: Right Arm)   Pulse 63   Temp 98.3 F (36.8 C) (Oral)   Resp 20   Ht  (1.676 m)   Wt 61.1 kg (134 lb 12.8 oz)   SpO2 100%   BMI 21.76 kg/m   Physical Exam GEN: appears well and comfortable. No apparent distress. Head: normocephalic and atraumatic  Eyes: conjunctiva without injection. Sclera anicteric. PERRLA. EOMI Ears: external ear, ear canal and TM normal Nares: no rhinorrhea.  No swollen turbinates.  No erythema of nasal mucosa Oropharynx: MMM.  No tongue swelling or lesion.  No erythema.  No exudation or petechiae.  Uvula midline.  HEM: negative for cervical or periauricular lymphadenopathies CVS: RRR, nl s1 & s2, no murmurs, no edema,  2+ DP pulses bilaterally, cap refills brisk RESP: no IWOB, good air movement bilaterally, CTAB GI: BS present & normal, soft, NTND, no guarding, no rebound, no mass GU: no suprapubic or CVA tenderness MSK: no focal tenderness or notable swelling SKIN: no apparent skin lesion  ENDO/EXO: negative thyromegaly.  No salivary gland tenderness or swelling NEURO: alert and oiented appropriately,  no gross deficits   PSYCH: euthymic mood with congruent affect   ED Treatments / Results  Labs (all labs ordered are listed, but only abnormal results are displayed) Labs Reviewed  RAPID STREP SCREEN (NOT AT [facility])  CULTURE, GROUP A STREP [facility])    EKG None  Radiology No results found.  Procedures Procedures (including critical care time)  Medications Ordered in ED Medications - No  data to display   Initial Impression / Assessment and Plan / ED Course  I have reviewed the triage vital signs and the nursing notes.  Pertinent labs & imaging results that were available during my care of the patient were reviewed by me and considered in my medical decision making (see chart for details).  27 year old male with no significant past medical history who presents with an episode of tongue swelling and discomfort with swallowing.  No recent illness, new medication or food.  His symptoms have completely resolved.  Physical exam within normal limits.  Rapid strep negative.  Discharge home.  Return precautions discussed.  Final Clinical Impressions(s) / ED Diagnoses   Final diagnoses:  Tongue swelling    ED Discharge Orders    None       Almon Hercules, MD 04/20/17 1647    Maia Plan, MD 04/20/17 1730

## 2017-04-20 NOTE — ED Triage Notes (Signed)
Pt stated that his tongue looks weird, and that he feels swelling on one side of tongue and mouth.

## 2017-04-23 LAB — CULTURE, GROUP A STREP (THRC)

## 2017-09-11 ENCOUNTER — Emergency Department (HOSPITAL_BASED_OUTPATIENT_CLINIC_OR_DEPARTMENT_OTHER)
Admission: EM | Admit: 2017-09-11 | Discharge: 2017-09-11 | Disposition: A | Discharge status: Home / Self Care | Payer: PRIVATE HEALTH INSURANCE | Attending: Emergency Medicine | Admitting: Emergency Medicine

## 2017-09-11 ENCOUNTER — Encounter (HOSPITAL_BASED_OUTPATIENT_CLINIC_OR_DEPARTMENT_OTHER): Payer: Self-pay

## 2017-09-11 ENCOUNTER — Other Ambulatory Visit: Payer: Self-pay

## 2017-09-11 DIAGNOSIS — R222 Localized swelling, mass and lump, trunk: Secondary | ICD-10-CM | POA: Insufficient documentation

## 2017-09-11 DIAGNOSIS — Z5321 Procedure and treatment not carried out due to patient leaving prior to being seen by health care provider: Secondary | ICD-10-CM | POA: Diagnosis not present

## 2017-09-11 NOTE — ED Triage Notes (Signed)
C/o "a knot" to left mid back-first noticed today-NAD-steady gait

## 2017-09-12 NOTE — ED Notes (Signed)
Follow up call made  No answer  09/12/17 1236  s Jamarri Vuncannon rn

## 2018-09-30 ENCOUNTER — Encounter (HOSPITAL_BASED_OUTPATIENT_CLINIC_OR_DEPARTMENT_OTHER): Payer: Self-pay | Admitting: *Deleted

## 2018-09-30 ENCOUNTER — Other Ambulatory Visit: Payer: Self-pay

## 2018-09-30 ENCOUNTER — Emergency Department (HOSPITAL_BASED_OUTPATIENT_CLINIC_OR_DEPARTMENT_OTHER)
Admission: EM | Admit: 2018-09-30 | Discharge: 2018-09-30 | Disposition: A | Discharge status: Home / Self Care | Payer: PRIVATE HEALTH INSURANCE | Attending: Emergency Medicine | Admitting: Emergency Medicine

## 2018-09-30 DIAGNOSIS — M542 Cervicalgia: Secondary | ICD-10-CM | POA: Insufficient documentation

## 2018-09-30 DIAGNOSIS — R7303 Prediabetes: Secondary | ICD-10-CM | POA: Insufficient documentation

## 2018-09-30 DIAGNOSIS — G8929 Other chronic pain: Secondary | ICD-10-CM | POA: Insufficient documentation

## 2018-09-30 DIAGNOSIS — Z87891 Personal history of nicotine dependence: Secondary | ICD-10-CM | POA: Insufficient documentation

## 2018-09-30 MED ORDER — ACETAMINOPHEN 500 MG PO TABS
1000.0000 mg | ORAL_TABLET | Freq: Once | ORAL | Status: AC
  Administered 2018-09-30: 1000 mg via ORAL
  Filled 2018-09-30: qty 2

## 2018-09-30 MED ORDER — NAPROXEN 500 MG PO TABS: 500.0000 mg | ORAL_TABLET | Freq: Two times a day (BID) | ORAL | 0 refills | Status: DC

## 2018-09-30 MED ORDER — NAPROXEN 250 MG PO TABS
500.0000 mg | ORAL_TABLET | Freq: Once | ORAL | Status: AC
  Administered 2018-09-30: 500 mg via ORAL
  Filled 2018-09-30: qty 2

## 2018-09-30 MED ORDER — METHOCARBAMOL 500 MG PO TABS
1000.0000 mg | ORAL_TABLET | Freq: Once | ORAL | Status: AC
  Administered 2018-09-30: 1000 mg via ORAL
  Filled 2018-09-30: qty 2

## 2018-09-30 NOTE — ED Notes (Signed)
Pt reports neck "irriatation" for months. States no change in symptoms prompting his visit to ED. States he doesn't like physical therapy because "they make me stretch and I don't have time for that." Pt states that he has been watching facebook videos of "professionals cracking people's necks." States "that's what I need." Informed patient that no one in the emergency department would be cracking his neck tonight.

## 2018-09-30 NOTE — ED Provider Notes (Signed)
[facility] Provider Note   CSN: [id number] Arrival date & time: 09/30/18  2223     History   Chief Complaint Chief Complaint  Patient presents with  . Neck Pain    HPI Barry Lester is a 28 y.o. male.     The history is provided by the patient.  Illness Location:  Neck Quality:  Needs to crack Severity:  Moderate Onset quality:  Gradual Timing:  Constant Progression:  Unchanged Chronicity:  Chronic Context:  Has been going on for a while.  No trauma Relieved by:  Nothing Worsened by:  Nothing Ineffective treatments:  None tried Associated symptoms: no abdominal pain, no chest pain, no congestion, no cough, no diarrhea, no ear pain, no fatigue, no fever, no headaches, no loss of consciousness, no myalgias, no nausea, no rash, no rhinorrhea, no shortness of breath, no sore throat, no vomiting and no wheezing   Risk factors:  None No headache no weakness no numbness.    Past Medical History:  Diagnosis Date  . Prediabetes     There are no active problems to display for this patient.   Past Surgical History:  Procedure Laterality Date  . APPENDECTOMY          Home Medications    Prior to Admission medications   Medication Sig Start Date End Date Taking? Authorizing Provider  naproxen (NAPROSYN) 500 MG tablet Take 1 tablet (500 mg total) by mouth 2 (two) times daily with a meal. 09/01/15   Margarita Mail, PA-C    Family History No family history on file.  Social History Social History   Tobacco Use  . Smoking status: Former Research scientist (life sciences)  . Smokeless tobacco: Never Used  Substance Use Topics  . Alcohol use: No  . Drug use: No     Allergies   Patient has no known allergies.   Review of Systems Review of Systems  Constitutional: Negative for fatigue and fever.  HENT: Negative for congestion, ear pain, rhinorrhea and sore throat.   Respiratory: Negative for cough, shortness of breath and wheezing.   Cardiovascular:  Negative for chest pain.  Gastrointestinal: Negative for abdominal pain, diarrhea, nausea and vomiting.  Genitourinary: Negative for difficulty urinating.  Musculoskeletal: Positive for neck pain. Negative for myalgias and neck stiffness.  Skin: Negative for rash.  Neurological: Negative for loss of consciousness and headaches.  Psychiatric/Behavioral: Negative for agitation.  All other systems reviewed and are negative.    Physical Exam Updated Vital Signs BP 124/75 (BP Location: Left Arm)   Pulse (!) 53   Temp 98.2 F (36.8 C) (Oral)   Resp 18   Ht 5\' 6"  (1.676 m)   Wt 59.9 kg   SpO2 100%   BMI 21.31 kg/m   Physical Exam Vitals signs and nursing note reviewed.  Constitutional:      General: He is not in acute distress.    Appearance: He is normal weight.  HENT:     Head: Normocephalic and atraumatic.     Nose: Nose normal.  Eyes:     Extraocular Movements: Extraocular movements intact.     Conjunctiva/sclera: Conjunctivae normal.     Pupils: Pupils are equal, round, and reactive to light.  Neck:     Musculoskeletal: Normal range of motion and neck supple. No neck rigidity or muscular tenderness.  Cardiovascular:     Rate and Rhythm: Normal rate and regular rhythm.     Pulses: Normal pulses.     Heart sounds: Normal  heart sounds.  Pulmonary:     Effort: Pulmonary effort is normal.     Breath sounds: Normal breath sounds.  Abdominal:     General: Abdomen is flat. Bowel sounds are normal.     Tenderness: There is no abdominal tenderness.  Musculoskeletal: Normal range of motion.  Lymphadenopathy:     Cervical: No cervical adenopathy.  Skin:    General: Skin is warm and dry.     Capillary Refill: Capillary refill takes less than 2 seconds.  Neurological:     General: No focal deficit present.     Mental Status: He is alert and oriented to person, place, and time.  Psychiatric:        Mood and Affect: Mood normal.        Behavior: Behavior normal.      ED  Treatments / Results  Labs (all labs ordered are listed, but only abnormal results are displayed) Labs Reviewed - No data to display  EKG None  Radiology No results found.  Procedures Procedures (including critical care time)  Medications Ordered in ED Medications  naproxen (NAPROSYN) tablet 500 mg (has no administration in time range)  acetaminophen (TYLENOL) tablet 1,000 mg (has no administration in time range)  methocarbamol (ROBAXIN) tablet 1,000 mg (has no administration in time range)      Chronic pain,  No LAN.  MSK pain.  NSAIDs and heat  Final Clinical Impressions(s) / ED Diagnoses   Return for intractable cough, coughing up blood,fevers >100.4 unrelieved by medication, shortness of breath, intractable vomiting, chest pain, shortness of breath, weakness,numbness, changes in speech, facial asymmetry,abdominal pain, passing out,Inability to tolerate liquids or food, cough, altered mental status or any concerns. No signs of systemic illness or infection. The patient is nontoxic-appearing on exam and vital signs are within normal limits.   I have reviewed the triage vital signs and the nursing notes. Pertinent labs &imaging results that were available during my care of the patient were reviewed by me and considered in my medical decision making (see chart for details).             After history, exam, and medical workup I feel the patient has been            appropriately medically screened and is safe for discharge home.          Pertinent diagnoses were discussed with the patient. Patient was       given    return precautions    Lizet Kelso, MD 09/30/18 2325

## 2018-09-30 NOTE — ED Triage Notes (Addendum)
Pt reports chronic neck pain. He's been seeing physical therapy for same. States he wants someone to pop his neck. States it feels more "irritated" this evening

## 2018-11-01 ENCOUNTER — Emergency Department (HOSPITAL_BASED_OUTPATIENT_CLINIC_OR_DEPARTMENT_OTHER)
Admission: EM | Admit: 2018-11-01 | Discharge: 2018-11-01 | Disposition: A | Discharge status: Home / Self Care | Payer: BC Managed Care – PPO | Attending: Emergency Medicine | Admitting: Emergency Medicine

## 2018-11-01 ENCOUNTER — Other Ambulatory Visit: Payer: Self-pay

## 2018-11-01 ENCOUNTER — Encounter (HOSPITAL_BASED_OUTPATIENT_CLINIC_OR_DEPARTMENT_OTHER): Payer: Self-pay | Admitting: Emergency Medicine

## 2018-11-01 DIAGNOSIS — Z87891 Personal history of nicotine dependence: Secondary | ICD-10-CM | POA: Insufficient documentation

## 2018-11-01 DIAGNOSIS — Z79899 Other long term (current) drug therapy: Secondary | ICD-10-CM | POA: Diagnosis not present

## 2018-11-01 DIAGNOSIS — M545 Low back pain, unspecified: Secondary | ICD-10-CM

## 2018-11-01 DIAGNOSIS — G8929 Other chronic pain: Secondary | ICD-10-CM | POA: Insufficient documentation

## 2018-11-01 MED ORDER — METHOCARBAMOL 500 MG PO TABS: 500.0000 mg | ORAL_TABLET | Freq: Every evening | ORAL | 0 refills | Status: AC | PRN

## 2018-11-01 NOTE — Discharge Instructions (Signed)
You were given a prescription for Robaxin which is a muscle relaxer.  You should not drive, work, or operate machinery while taking this medication as it can make you very drowsy.    Please follow up with your primary care provider within 5-7 days for re-evaluation of your symptoms. If you do not have a primary care provider, information for a healthcare clinic has been provided for you to make arrangements for follow up care.   Return to the emergency department immediately if you experience any back pain associated with fevers, loss of control of your bowels/bladder, weakness/numbness to your legs, numbness to your groin area, inability to walk, or inability to urinate.    

## 2018-11-01 NOTE — ED Triage Notes (Signed)
Chronic back pain in lower lumbar/sacral region going on "for a while" States feels like "jamming." seen in ED and PCP for same, was taking naproxen and laxatives with no relief. No tingling or numbness in legs, denies incontinence of bowel and bladder.

## 2018-11-01 NOTE — ED Provider Notes (Signed)
[facility] EMERGENCY DEPARTMENT Provider Note   CSN: [id number] Arrival date & time: 11/01/18  2009     History   Chief Complaint Chief Complaint  Patient presents with  . Back Pain    HPI Barry Lester is a 28 y.o. male.     HPI   Patient is a 28 year old male with history prediabetes who presents emergency department today complaining of lower back pain that is been ongoing for the last few months.  He denies any exacerbating injuries.  He states that it feels like he needs to pop/crack his back but he cannot.  He is tried naproxen without relief.  He denies any numbness/weakness of the legs.  No fevers.  No history of IV drug use or cancer.  No urinary retention or loss control of bowel or bladder function.  Past Medical History:  Diagnosis Date  . Prediabetes     There are no active problems to display for this patient.   Past Surgical History:  Procedure Laterality Date  . APPENDECTOMY          Home Medications    Prior to Admission medications   Medication Sig Start Date End Date Taking? Authorizing Provider  methocarbamol (ROBAXIN) 500 MG tablet Take 1 tablet (500 mg total) by mouth at bedtime as needed for up to 5 days for muscle spasms. 11/01/18 11/06/18  Kennedy Bohanon S, PA-C  naproxen (NAPROSYN) 500 MG tablet Take 1 tablet (500 mg total) by mouth 2 (two) times daily with a meal. 09/01/15   Arthor Captain, PA-C  naproxen (NAPROSYN) 500 MG tablet Take 1 tablet (500 mg total) by mouth 2 (two) times daily with a meal. 09/30/18   Palumbo, April, MD    Family History History reviewed. No pertinent family history.  Social History Social History   Tobacco Use  . Smoking status: Former Games developer  . Smokeless tobacco: Never Used  Substance Use Topics  . Alcohol use: No  . Drug use: No     Allergies   Patient has no known allergies.   Review of Systems Review of Systems  Gastrointestinal: Negative for abdominal pain, constipation, diarrhea,  nausea and vomiting.       No loss of control of bowel function.  Genitourinary: Negative for dysuria.       No loss of control of bladder function or urinary retention  Musculoskeletal: Positive for back pain.  Neurological: Negative for weakness and numbness.    Physical Exam Updated Vital Signs BP 138/79 (BP Location: Right Arm)   Pulse 64   Temp 98.4 F (36.9 C) (Oral)   Resp 14   Ht 5\' 6"  (1.676 m)   Wt 63 kg   SpO2 99%   BMI 22.44 kg/m   Physical Exam Constitutional:      General: He is not in acute distress.    Appearance: He is well-developed.  Eyes:     Conjunctiva/sclera: Conjunctivae normal.  Cardiovascular:     Rate and Rhythm: Normal rate.  Pulmonary:     Effort: Pulmonary effort is normal.  Musculoskeletal:     Comments: No midline lumbar tenderness.  There is TTP to the left lumbar paraspinous muscles that reproduces his pain.  5/5 strength to the bilateral lower extremities.  Normal sensation throughout.  Skin:    General: Skin is warm and dry.  Neurological:     Mental Status: He is alert and oriented to person, place, and time.      ED Treatments /  Results  Labs (all labs ordered are listed, but only abnormal results are displayed) Labs Reviewed - No data to display  EKG None  Radiology No results found.  Procedures Procedures (including critical care time)  Medications Ordered in ED Medications - No data to display   Initial Impression / Assessment and Plan / ED Course  I have reviewed the triage vital signs and the nursing notes.  Pertinent labs & imaging results that were available during my care of the patient were reviewed by me and considered in my medical decision making (see chart for details).     Final Clinical Impressions(s) / ED Diagnoses   Final diagnoses:  Chronic left-sided low back pain without sciatica   Patient with back pain.  No neurological deficits and normal neuro exam.  Patient can walk but states is  painful.  No loss of bowel or bladder control.  No concern for cauda equina.  No fever, night sweats, weight loss, h/o cancer, IVDU.  RICE protocol and pain medicine indicated and discussed with patient.   ED Discharge Orders         Ordered    methocarbamol (ROBAXIN) 500 MG tablet  At bedtime PRN     11/01/18 2114           Rodney Booze, PA-C 11/01/18 2114    Hayden Rasmussen, MD 11/01/18 2216

## 2019-02-05 ENCOUNTER — Encounter (HOSPITAL_BASED_OUTPATIENT_CLINIC_OR_DEPARTMENT_OTHER): Payer: Self-pay | Admitting: Emergency Medicine

## 2019-02-05 ENCOUNTER — Other Ambulatory Visit: Payer: Self-pay

## 2019-02-05 ENCOUNTER — Emergency Department (HOSPITAL_BASED_OUTPATIENT_CLINIC_OR_DEPARTMENT_OTHER)
Admission: EM | Admit: 2019-02-05 | Discharge: 2019-02-05 | Disposition: A | Discharge status: Home / Self Care | Payer: PRIVATE HEALTH INSURANCE | Attending: Emergency Medicine | Admitting: Emergency Medicine

## 2019-02-05 ENCOUNTER — Emergency Department (HOSPITAL_BASED_OUTPATIENT_CLINIC_OR_DEPARTMENT_OTHER): Payer: PRIVATE HEALTH INSURANCE

## 2019-02-05 DIAGNOSIS — R001 Bradycardia, unspecified: Secondary | ICD-10-CM | POA: Insufficient documentation

## 2019-02-05 DIAGNOSIS — R0789 Other chest pain: Secondary | ICD-10-CM | POA: Insufficient documentation

## 2019-02-05 DIAGNOSIS — Z87891 Personal history of nicotine dependence: Secondary | ICD-10-CM | POA: Insufficient documentation

## 2019-02-05 LAB — CBC WITH DIFFERENTIAL/PLATELET
Abs Immature Granulocytes: 0.02 10*3/uL (ref 0.00–0.07)
Basophils Absolute: 0 10*3/uL (ref 0.0–0.1)
Basophils Relative: 0 %
Eosinophils Absolute: 0 10*3/uL (ref 0.0–0.5)
Eosinophils Relative: 0 %
HCT: 46.7 % (ref 39.0–52.0)
Hemoglobin: 15.7 g/dL (ref 13.0–17.0)
Immature Granulocytes: 0 %
Lymphocytes Relative: 25 %
Lymphs Abs: 2.5 10*3/uL (ref 0.7–4.0)
MCH: 29.5 pg (ref 26.0–34.0)
MCHC: 33.6 g/dL (ref 30.0–36.0)
MCV: 87.8 fL (ref 80.0–100.0)
Monocytes Absolute: 0.7 10*3/uL (ref 0.1–1.0)
Monocytes Relative: 7 %
Neutro Abs: 6.6 10*3/uL (ref 1.7–7.7)
Neutrophils Relative %: 68 %
Platelets: 274 10*3/uL (ref 150–400)
RBC: 5.32 MIL/uL (ref 4.22–5.81)
RDW: 13.2 % (ref 11.5–15.5)
WBC: 9.8 10*3/uL (ref 4.0–10.5)
nRBC: 0 % (ref 0.0–0.2)

## 2019-02-05 LAB — TROPONIN I (HIGH SENSITIVITY): Troponin I (High Sensitivity): 3 ng/L (ref <18)

## 2019-02-05 LAB — COMPREHENSIVE METABOLIC PANEL
ALT: 17 U/L (ref 0–44)
AST: 18 U/L (ref 15–41)
Albumin: 4.3 g/dL (ref 3.5–5.0)
Alkaline Phosphatase: 44 U/L (ref 38–126)
Anion gap: 7 (ref 5–15)
BUN: 9 mg/dL (ref 6–20)
CO2: 26 mmol/L (ref 22–32)
Calcium: 9.1 mg/dL (ref 8.9–10.3)
Chloride: 103 mmol/L (ref 98–111)
Creatinine, Ser: 1.13 mg/dL (ref 0.61–1.24)
GFR calc Af Amer: >60 mL/min (ref >60)
GFR calc non Af Amer: >60 mL/min (ref >60)
Glucose, Bld: 131 mg/dL — ABNORMAL HIGH (ref 70–99)
Potassium: 4.4 mmol/L (ref 3.5–5.1)
Sodium: 136 mmol/L (ref 135–145)
Total Bilirubin: 1 mg/dL (ref 0.3–1.2)
Total Protein: 7.4 g/dL (ref 6.5–8.1)

## 2019-02-05 NOTE — ED Provider Notes (Signed)
[facility] EMERGENCY DEPARTMENT Provider Note   CSN: [id number] Arrival date & time: 02/05/19  0847     History Chief Complaint  Patient presents with  . Chest Pain    Barry Lester is a 29 y.o. male.  The history is provided by the patient and medical records. No language interpreter was used.  Chest Pain  Barry Lester is a 29 y.o. male who presents to the Emergency Department complaining of chest pain. He presents the emergency department complaining of five days of intermittent chest pain described as a bear hugging his chest sensation. The episodes wax and wane and lasts about 10 to 15 seconds at the time. He has no clear alleviating or worsening factors. He states that he feels like his heart is slowing down when his episodes occur. He denies any fevers, shortness of breath, cough, nausea, vomiting, abdominal pain, leg swelling or pain. He has no medical problems and takes no medications. He has a family history of coronary artery disease and his father at the age of 8, grandfather in his 36s.  He has an uncle that required a pacemaker. He denies any over-the-counter medications. He denies any tobacco, alcohol, drug use.    Past Medical History:  Diagnosis Date  . Prediabetes     There are no problems to display for this patient.   Past Surgical History:  Procedure Laterality Date  . APPENDECTOMY         No family history on file.  Social History   Tobacco Use  . Smoking status: Former Games developer  . Smokeless tobacco: Never Used  Substance Use Topics  . Alcohol use: No  . Drug use: No    Home Medications Prior to Admission medications   Not on File    Allergies    Patient has no known allergies.  Review of Systems   Review of Systems  Cardiovascular: Positive for chest pain.  All other systems reviewed and are negative.   Physical Exam Updated Vital Signs BP 138/62   Pulse (!) 53   Temp 98.4 F (36.9 C) (Oral)   Resp 16   Ht 5\' 6"   (1.676 m)   Wt 58.5 kg   SpO2 100%   BMI 20.82 kg/m   Physical Exam Vitals and nursing note reviewed.  Constitutional:      Appearance: He is well-developed.  HENT:     Head: Normocephalic and atraumatic.  Cardiovascular:     Rate and Rhythm: Regular rhythm. Bradycardia present.     Heart sounds: No murmur.  Pulmonary:     Effort: Pulmonary effort is normal. No respiratory distress.     Breath sounds: Normal breath sounds.  Abdominal:     Palpations: Abdomen is soft.     Tenderness: There is no guarding or rebound.  Musculoskeletal:        General: No swelling or tenderness.  Skin:    General: Skin is warm and dry.  Neurological:     Mental Status: He is alert and oriented to person, place, and time.  Psychiatric:        Behavior: Behavior normal.     ED Results / Procedures / Treatments   Labs (all labs ordered are listed, but only abnormal results are displayed) Labs Reviewed  COMPREHENSIVE METABOLIC PANEL - Abnormal; Notable for the following components:      Result Value   Glucose, Bld 131 (*)    All other components within normal limits  CBC WITH DIFFERENTIAL/PLATELET  TROPONIN I (HIGH SENSITIVITY)    EKG EKG Interpretation  Date/Time:  Tuesday February 05 2019 08:58:13 EST Ventricular Rate:  58 PR Interval:    QRS Duration: 89 QT Interval:  420 QTC Calculation: 413 R Axis:   95 Text Interpretation: Sinus rhythm Borderline right axis deviation Confirmed by Quintella Reichert [phone]) on 02/05/2019 9:03:38 AM   Radiology DG Chest 2 View  Result Date: 02/05/2019 CLINICAL DATA:  Chest tightness EXAM: CHEST - 2 VIEW COMPARISON:  2017 FINDINGS: The heart size and mediastinal contours are within normal limits. Both lungs are clear. No pleural effusion or pneumothorax. The visualized skeletal structures are unremarkable. IMPRESSION: No acute process in chest. Electronically Signed   By: Macy Mis M.D.   On: 02/05/2019 09:54    Procedures Procedures  (including critical care time)  Medications Ordered in ED Medications - No data to display  ED Course  I have reviewed the triage vital signs and the nursing notes.  Pertinent labs & imaging results that were available during my care of the patient were reviewed by me and considered in my medical decision making (see chart for details).    MDM Rules/Calculators/A&P                     Patient with a family history of coronary artery disease here for evaluation of five days of intermittent chest discomfort. He is non-toxic appearing on evaluation with no acute distress. EKG without acute ischemic changes and unchanged when compared to prior. Troponin is negative. Presentation is not consistent with ACS, PE, dissection, pneumonia. Discussed with patient home care for atypical chest pain, palpitations. Discussed possible anxiety component. Discussed outpatient follow-up and return precautions.  Final Clinical Impression(s) / ED Diagnoses Final diagnoses:  Atypical chest pain    Rx / DC Orders ED Discharge Orders    None       Quintella Reichert, MD 02/05/19 1102

## 2019-02-05 NOTE — ED Triage Notes (Signed)
Pt has been having episodes of "chest tightness" that last about 10 seconds and then stop.  He has been having them all year last year but they are coming more frequently. Has been evaluated medically.  Sts "it feels like a bear hugging me."  Is not having the tightness at this time but did earlier this morning and daily x5 days.

## 2019-05-20 ENCOUNTER — Encounter (HOSPITAL_BASED_OUTPATIENT_CLINIC_OR_DEPARTMENT_OTHER): Payer: Self-pay

## 2019-05-20 ENCOUNTER — Emergency Department (HOSPITAL_BASED_OUTPATIENT_CLINIC_OR_DEPARTMENT_OTHER)
Admission: EM | Admit: 2019-05-20 | Discharge: 2019-05-20 | Disposition: A | Discharge status: Home / Self Care | Payer: PRIVATE HEALTH INSURANCE | Attending: Emergency Medicine | Admitting: Emergency Medicine

## 2019-05-20 ENCOUNTER — Other Ambulatory Visit: Payer: Self-pay

## 2019-05-20 DIAGNOSIS — R7303 Prediabetes: Secondary | ICD-10-CM | POA: Insufficient documentation

## 2019-05-20 DIAGNOSIS — Z87891 Personal history of nicotine dependence: Secondary | ICD-10-CM | POA: Insufficient documentation

## 2019-05-20 DIAGNOSIS — K0889 Other specified disorders of teeth and supporting structures: Secondary | ICD-10-CM | POA: Insufficient documentation

## 2019-05-20 MED ORDER — PENICILLIN V POTASSIUM 500 MG PO TABS: 500.0000 mg | ORAL_TABLET | Freq: Four times a day (QID) | ORAL | 0 refills | Status: AC

## 2019-05-20 MED ORDER — NAPROXEN 500 MG PO TABS: 500.0000 mg | ORAL_TABLET | Freq: Two times a day (BID) | ORAL | 0 refills | Status: DC

## 2019-05-20 NOTE — ED Provider Notes (Signed)
[facility] Emergency Department Provider Note MRN:  [id number]  Arrival date & time: 05/20/19     Chief Complaint   Dental Pain   History of Present Illness   Barry Lester is a 29 y.o. year-old male with no pertinent past medical history presenting to the ED with chief complaint of dental pain.  Right upper tooth pain, present for 1 month, worsening over the past few days.  Has a dentist appointment in a few days but the pain is keeping him from sleeping.  Denies fever, no other complaints.  Review of Systems  A problem-focused ROS was performed. Positive for tooth pain.  Patient denies fever.  Patient's Health History    Past Medical History:  Diagnosis Date  . Prediabetes     Past Surgical History:  Procedure Laterality Date  . APPENDECTOMY      No family history on file.  Social History   Socioeconomic History  . Marital status: Single    Spouse name: Not on file  . Number of children: Not on file  . Years of education: Not on file  . Highest education level: Not on file  Occupational History  . Not on file  Tobacco Use  . Smoking status: Former Research scientist (life sciences)  . Smokeless tobacco: Never Used  Substance and Sexual Activity  . Alcohol use: No  . Drug use: No  . Sexual activity: Not on file  Other Topics Concern  . Not on file  Social History Narrative  . Not on file   Social Determinants of Health   Financial Resource Strain:   . Difficulty of Paying Living Expenses:   Food Insecurity:   . Worried About Charity fundraiser in the Last Year:   . Arboriculturist in the Last Year:   Transportation Needs:   . Film/video editor (Medical):   Marland Kitchen Lack of Transportation (Non-Medical):   Physical Activity:   . Days of Exercise per Week:   . Minutes of Exercise per Session:   Stress:   . Feeling of Stress :   Social Connections:   . Frequency of Communication with Friends and Family:   . Frequency of Social Gatherings with  Friends and Family:   . Attends Religious Services:   . Active Member of Clubs or Organizations:   . Attends Archivist Meetings:   Marland Kitchen Marital Status:   Intimate Partner Violence:   . Fear of Current or Ex-Partner:   . Emotionally Abused:   Marland Kitchen Physically Abused:   . Sexually Abused:      Physical Exam   Vitals:   05/20/19 1116  BP: (!) 140/93  Pulse: (!) 58  Resp: 18  Temp: 98 F (36.7 C)  SpO2: 97%    CONSTITUTIONAL: Well-appearing, NAD NEURO:  Alert and oriented x 3, no focal deficits EYES:  eyes equal and reactive ENT/NECK:  no LAD, no JVD CARDIO: Regular rate, well-perfused, normal S1 and S2 PULM:  CTAB no wheezing or rhonchi GI/GU:  normal bowel sounds, non-distended, non-tender MSK/SPINE:  No gross deformities, no edema SKIN:  no rash, atraumatic PSYCH:  Appropriate speech and behavior  *Additional and/or pertinent findings included in MDM below  Diagnostic and Interventional Summary    EKG Interpretation  Date/Time:    Ventricular Rate:    PR Interval:    QRS Duration:   QT Interval:    QTC Calculation:   R Axis:     Text Interpretation:  Labs Reviewed - No data to display  No orders to display    Medications - No data to display   Procedures  /  Critical Care Procedures  ED Course and Medical Decision Making  I have reviewed the triage vital signs, the nursing notes, and pertinent available records from the EMR.  Listed above are laboratory and imaging tests that I personally ordered, reviewed, and interpreted and then considered in my medical decision making (see below for details).      Tooth pain without signs of abscess on exam, mild tenderness to the upper right molar.  No signs of more significant contiguous infection.  Appropriate for discharge.    Elmer Sow. Pilar Plate, MD [facility] [email or web address]  Final Clinical Impressions(s) / ED Diagnoses     ICD-10-CM   1.  Pain, dental  K08.89     ED Discharge Orders         Ordered    penicillin v potassium (VEETID) 500 MG tablet  4 times daily     05/20/19 1124    naproxen (NAPROSYN) 500 MG tablet  2 times daily     05/20/19 1124           Discharge Instructions Discussed with and Provided to Patient:     Discharge Instructions     You were evaluated in the Emergency Department and after careful evaluation, we did not find any emergent condition requiring admission or further testing in the hospital.  Your exam/testing today is overall reassuring.  Please take the medications provided as directed and follow-up with your dentist.  Please return to the Emergency Department if you experience any worsening of your condition.  We encourage you to follow up with a primary care provider.  Thank you for allowing [place] to be a part of your care.      Sabas Sous, MD 05/20/19 [id number]

## 2019-05-20 NOTE — ED Triage Notes (Signed)
Pt c/o dental pain to right upper mouth has a dental appointment on Thursday, pt reports pain into right face.

## 2019-05-20 NOTE — Discharge Instructions (Addendum)
You were evaluated in the Emergency Department and after careful evaluation, we did not find any emergent condition requiring admission or further testing in the hospital.  Your exam/testing today is overall reassuring.  Please take the medications provided as directed and follow-up with your dentist.  Please return to the Emergency Department if you experience any worsening of your condition.  We encourage you to follow up with a primary care provider.  Thank you for allowing [place] to be a part of your care.

## 2019-09-10 ENCOUNTER — Emergency Department (HOSPITAL_BASED_OUTPATIENT_CLINIC_OR_DEPARTMENT_OTHER): Payer: PRIVATE HEALTH INSURANCE

## 2019-09-10 ENCOUNTER — Emergency Department (HOSPITAL_BASED_OUTPATIENT_CLINIC_OR_DEPARTMENT_OTHER)
Admission: EM | Admit: 2019-09-10 | Discharge: 2019-09-10 | Disposition: A | Discharge status: Home / Self Care | Payer: PRIVATE HEALTH INSURANCE | Attending: Emergency Medicine | Admitting: Emergency Medicine

## 2019-09-10 ENCOUNTER — Encounter (HOSPITAL_BASED_OUTPATIENT_CLINIC_OR_DEPARTMENT_OTHER): Payer: Self-pay | Admitting: *Deleted

## 2019-09-10 ENCOUNTER — Other Ambulatory Visit: Payer: Self-pay

## 2019-09-10 DIAGNOSIS — X58XXXA Exposure to other specified factors, initial encounter: Secondary | ICD-10-CM | POA: Diagnosis not present

## 2019-09-10 DIAGNOSIS — Z87891 Personal history of nicotine dependence: Secondary | ICD-10-CM | POA: Insufficient documentation

## 2019-09-10 DIAGNOSIS — S6992XA Unspecified injury of left wrist, hand and finger(s), initial encounter: Secondary | ICD-10-CM

## 2019-09-10 DIAGNOSIS — Y929 Unspecified place or not applicable: Secondary | ICD-10-CM | POA: Diagnosis not present

## 2019-09-10 DIAGNOSIS — Y9361 Activity, american tackle football: Secondary | ICD-10-CM | POA: Diagnosis not present

## 2019-09-10 DIAGNOSIS — S60922A Unspecified superficial injury of left hand, initial encounter: Secondary | ICD-10-CM | POA: Diagnosis present

## 2019-09-10 DIAGNOSIS — Y999 Unspecified external cause status: Secondary | ICD-10-CM | POA: Diagnosis not present

## 2019-09-10 NOTE — Discharge Instructions (Addendum)
You have been seen here for left hand pain.  Imaging and exam look reassuring.  I recommend over-the-counter medications like ibuprofen or Tylenol every 6 hours as needed for pain.  Please follow dosing on the back of bottle.  You may also apply ice to the area as this can help decrease inflammation and swelling.  I have given you the contact information for a hand specialist if symptoms persist or worsen after 2 weeks I want you to follow-up with them for further evaluation.  I want to come back to emergency department if you develop pins-and-needles sensation in your hand, your hand turns in color, you are unable to close your hand, chest pain, shortness of breath, uncontrolled nausea, vomiting, diarrhea as the symptoms require further evaluation management.

## 2019-09-10 NOTE — ED Notes (Signed)
ED Provider at bedside. 

## 2019-09-10 NOTE — ED Triage Notes (Addendum)
C/o left hand pain with ? injury x 2 days

## 2019-09-10 NOTE — ED Provider Notes (Signed)
[facility] EMERGENCY DEPARTMENT Provider Note   CSN: [id number] Arrival date & time: 09/10/19  1611     History Chief Complaint  Patient presents with  . Hand Pain    Barry Lester is a 29 y.o. male.  HPI   Patient with no significant medical history presents to the emergency department with chief complaint of left hand pain that started yesterday after he was playing football.  Patient states he is unsure what play he hurt it on but states after the game he felt some pain on the ulnar side of his hand.  He states it hurts when he bends his pinky finger, denies alleviating or aggravating factors.  He denies hitting his head, losing consciousness, is not is not on any anticoags.  He is left-hand-dominant.  He denies headache, fever, chills, shortness of breath, chest pain, abdominal pain, pedal edema.   Past Medical History:  Diagnosis Date  . Prediabetes     There are no problems to display for this patient.   Past Surgical History:  Procedure Laterality Date  . APPENDECTOMY         History reviewed. No pertinent family history.  Social History   Tobacco Use  . Smoking status: Former Games developer  . Smokeless tobacco: Never Used  Vaping Use  . Vaping Use: Never used  Substance Use Topics  . Alcohol use: No  . Drug use: No    Home Medications Prior to Admission medications   Medication Sig Start Date End Date Taking? Authorizing Provider  naproxen (NAPROSYN) 500 MG tablet Take 1 tablet (500 mg total) by mouth 2 (two) times daily. 05/20/19   Sabas Sous, MD    Allergies    Patient has no known allergies.  Review of Systems   Review of Systems  Constitutional: Negative for chills and fever.  HENT: Negative for congestion.   Respiratory: Negative for shortness of breath.   Cardiovascular: Negative for chest pain.  Gastrointestinal: Negative for abdominal pain.  Genitourinary: Negative for enuresis.  Musculoskeletal: Negative for back pain.        Admits to left hand pain.  Skin: Negative for rash.  Neurological: Negative for dizziness.  Hematological: Does not bruise/bleed easily.    Physical Exam Updated Vital Signs BP 131/71   Pulse 73   Temp 98.2 F (36.8 C)   Resp 18   Ht 5\' 6"  (1.676 m)   Wt 59.9 kg   SpO2 100%   BMI 21.31 kg/m   Physical Exam Vitals and nursing note reviewed.  Constitutional:      General: He is not in acute distress.    Appearance: Normal appearance. He is not ill-appearing or diaphoretic.  HENT:     Head: Normocephalic and atraumatic.     Nose: No congestion or rhinorrhea.  Eyes:     General: No scleral icterus.       Right eye: No discharge.        Left eye: No discharge.     Conjunctiva/sclera: Conjunctivae normal.  Pulmonary:     Effort: Pulmonary effort is normal. No respiratory distress.     Breath sounds: Normal breath sounds. No wheezing.  Musculoskeletal:     Cervical back: Neck supple.     Right lower leg: No edema.     Left lower leg: No edema.     Comments: Patient is left-handed dominant, he is able to articulate his fingers at the proximal, middle and distal joints without difficulty.  Neurovascular was  fully intact in all fingers.  Patient had 5-5 strength in all fingers and joints.  Skin:    General: Skin is warm and dry.     Coloration: Skin is not jaundiced or pale.  Neurological:     Mental Status: He is alert and oriented to person, place, and time.  Psychiatric:        Mood and Affect: Mood normal.     ED Results / Procedures / Treatments   Labs (all labs ordered are listed, but only abnormal results are displayed) Labs Reviewed - No data to display  EKG None  Radiology DG Hand Complete Left  Result Date: 09/10/2019 CLINICAL DATA:  Hand injury.  Hand pain EXAM: LEFT HAND - COMPLETE 3+ VIEW COMPARISON:  None. FINDINGS: There is no evidence of fracture or dislocation. There is no evidence of arthropathy or other focal bone abnormality. Soft tissues are  unremarkable. IMPRESSION: Negative. Electronically Signed   By: Marlan Palau M.D.   On: 09/10/2019 16:44    Procedures Procedures (including critical care time)  Medications Ordered in ED Medications - No data to display  ED Course  I have reviewed the triage vital signs and the nursing notes.  Pertinent labs & imaging results that were available during my care of the patient were reviewed by me and considered in my medical decision making (see chart for details).    MDM Rules/Calculators/A&P                          I have personally reviewed all imaging, labs and have interpreted them.  Patient was alert and oriented did not appear to be in acute distress, vital signs reassuring.  Patient had full range of motion in his fingers and wrist.  5-5 strength in all fingers and wrist.  Neurovascular fully intact.  Will order imaging of the hand.  Imaging did not show any acute abnormalities.  I have low suspicion for compartment syndrome as neurovascular was fully intact in all fingers.  Low suspicion for ligament or tendon damage as he had full range of motion in all fingers and wrist, no decreased strength noted.  Low suspicion for fracture or dislocation as x-ray came back unremarkable.  Possible patient may have a delayed boxer fracture but I feel this is unlikely as patient minimal tenderness to palpation on the distal ulnar side of his fifth metacarpal.  Will recommend follow-up with hand if symptoms worsen or do not improve.  Patient resting comfortably in bed showing no acute signs stress.  Vital signs have remained stable does not meet criteria to be admitted to the hospital.  Most likely patient has a muscular strain of his left hand but if symptoms do improve in 2 weeks recommend follow-up with hand for further evaluation.  Patient discussed with attending who agrees with assessment and plan.  Patient was given at home care as well strict return precautions. Final Clinical  Impression(s) / ED Diagnoses Final diagnoses:  Hand injury, left, initial encounter    Rx / DC Orders ED Discharge Orders    None       Carroll Sage, PA-C 09/10/19 1711    Tegeler, Canary Brim, MD 09/10/19 2328

## 2019-10-04 ENCOUNTER — Emergency Department (HOSPITAL_BASED_OUTPATIENT_CLINIC_OR_DEPARTMENT_OTHER)
Admission: EM | Admit: 2019-10-04 | Discharge: 2019-10-04 | Disposition: A | Discharge status: Home / Self Care | Payer: PRIVATE HEALTH INSURANCE | Attending: Emergency Medicine | Admitting: Emergency Medicine

## 2019-10-04 ENCOUNTER — Emergency Department (HOSPITAL_BASED_OUTPATIENT_CLINIC_OR_DEPARTMENT_OTHER): Payer: PRIVATE HEALTH INSURANCE

## 2019-10-04 ENCOUNTER — Encounter (HOSPITAL_BASED_OUTPATIENT_CLINIC_OR_DEPARTMENT_OTHER): Payer: Self-pay | Admitting: Emergency Medicine

## 2019-10-04 ENCOUNTER — Other Ambulatory Visit: Payer: Self-pay

## 2019-10-04 DIAGNOSIS — M79642 Pain in left hand: Secondary | ICD-10-CM | POA: Insufficient documentation

## 2019-10-04 DIAGNOSIS — Z87891 Personal history of nicotine dependence: Secondary | ICD-10-CM | POA: Diagnosis not present

## 2019-10-04 NOTE — Discharge Instructions (Signed)
Your x-ray was negative for fracture.  Please follow-up with the family doctor in the office.  We will place you in a splint to help you try and rest this area.  Take Tylenol and ibuprofen as needed for pain.

## 2019-10-04 NOTE — ED Triage Notes (Signed)
Left hand swelling, no injury . Painful to tough.

## 2019-10-04 NOTE — ED Provider Notes (Signed)
[facility] EMERGENCY DEPARTMENT Provider Note   CSN: [id number] Arrival date & time: 10/04/19  1028     History Chief Complaint  Patient presents with  . Hand Pain    Barry Lester is a 29 y.o. male.  29 yo M with a chief complaints of left hand pain.  This is at the neck of the fifth metacarpal.  Denies overt injury.  Worse with movement and palpation.  No fevers.  The history is provided by the patient.  Hand Pain This is a new problem. The current episode started 2 days ago. The problem occurs constantly. The problem has not changed since onset.Pertinent negatives include no chest pain, no abdominal pain, no headaches and no shortness of breath. The symptoms are aggravated by bending and twisting. Nothing relieves the symptoms. He has tried nothing for the symptoms. The treatment provided no relief.       Past Medical History:  Diagnosis Date  . Prediabetes     There are no problems to display for this patient.   Past Surgical History:  Procedure Laterality Date  . APPENDECTOMY         No family history on file.  Social History   Tobacco Use  . Smoking status: Former Games developer  . Smokeless tobacco: Never Used  Vaping Use  . Vaping Use: Never used  Substance Use Topics  . Alcohol use: No  . Drug use: No    Home Medications Prior to Admission medications   Medication Sig Start Date End Date Taking? Authorizing Provider  naproxen (NAPROSYN) 500 MG tablet Take 1 tablet (500 mg total) by mouth 2 (two) times daily. 05/20/19   Sabas Sous, MD    Allergies    Patient has no known allergies.  Review of Systems   Review of Systems  Constitutional: Negative for chills and fever.  HENT: Negative for congestion and facial swelling.   Eyes: Negative for discharge and visual disturbance.  Respiratory: Negative for shortness of breath.   Cardiovascular: Negative for chest pain and palpitations.  Gastrointestinal: Negative for abdominal pain,  diarrhea and vomiting.  Musculoskeletal: Negative for arthralgias and myalgias.  Skin: Negative for color change and rash.  Neurological: Negative for tremors, syncope and headaches.  Psychiatric/Behavioral: Negative for confusion and dysphoric mood.    Physical Exam Updated Vital Signs BP 116/88   Pulse (!) 55   Temp 98.6 F (37 C) (Oral)   Resp 18   Ht 5\' 6"  (1.676 m)   Wt 61.2 kg   SpO2 100%   BMI 21.79 kg/m   Physical Exam Vitals and nursing note reviewed.  Constitutional:      Appearance: He is well-developed.  HENT:     Head: Normocephalic and atraumatic.  Eyes:     Pupils: Pupils are equal, round, and reactive to light.  Neck:     Vascular: No JVD.  Cardiovascular:     Rate and Rhythm: Normal rate and regular rhythm.     Heart sounds: No murmur heard.  No friction rub. No gallop.   Pulmonary:     Effort: No respiratory distress.     Breath sounds: No wheezing.  Abdominal:     General: There is no distension.     Tenderness: There is no guarding or rebound.  Musculoskeletal:        General: Tenderness present. Normal range of motion.     Cervical back: Normal range of motion and neck supple.     Comments: Tenderness  worse at the fifth metacarpal neck.  Full range of motion of the fingers distally.  No pain at the wrist.  No pain along the flexor tendon sheath.  Skin:    Coloration: Skin is not pale.     Findings: No rash.  Neurological:     Mental Status: He is alert and oriented to person, place, and time.  Psychiatric:        Behavior: Behavior normal.     ED Results / Procedures / Treatments   Labs (all labs ordered are listed, but only abnormal results are displayed) Labs Reviewed - No data to display  EKG None  Radiology DG Hand Complete Left  Result Date: 10/04/2019 CLINICAL DATA:  Left hand swelling.  No injury EXAM: LEFT HAND - COMPLETE 3+ VIEW COMPARISON:  09/10/2019 FINDINGS: There is no evidence of fracture or dislocation. There is no  evidence of arthropathy or other focal bone abnormality. Soft tissues are unremarkable. IMPRESSION: Negative. Electronically Signed   By: Duanne Guess D.O.   On: 10/04/2019 13:17    Procedures Procedures (including critical care time)  Medications Ordered in ED Medications - No data to display  ED Course  I have reviewed the triage vital signs and the nursing notes.  Pertinent labs & imaging results that were available during my care of the patient were reviewed by me and considered in my medical decision making (see chart for details).    MDM Rules/Calculators/A&P                          29 yo M with a chief complaint of left hand pain.  In the area would expect to have a boxer's fracture though he does not endorse any trauma.  Will obtain a plain film.  He had by me and negative.  Will place in a removable splint.  PCP follow-up.  1:36 PM:  I have discussed the diagnosis/risks/treatment options with the patient and believe the pt to be eligible for discharge home to follow-up with PCP. We also discussed returning to the ED immediately if new or worsening sx occur. We discussed the sx which are most concerning (e.g., sudden worsening pain, fever, inability to tolerate by mouth) that necessitate immediate return. Medications administered to the patient during their visit and any new prescriptions provided to the patient are listed below.  Medications given during this visit Medications - No data to display   The patient appears reasonably screen and/or stabilized for discharge and I doubt any other medical condition or other [facility] requiring further screening, evaluation, or treatment in the ED at this time prior to discharge.   Final Clinical Impression(s) / ED Diagnoses Final diagnoses:  Left hand pain    Rx / DC Orders ED Discharge Orders    None       Melene Plan, DO 10/04/19 1336

## 2019-12-27 ENCOUNTER — Emergency Department (HOSPITAL_BASED_OUTPATIENT_CLINIC_OR_DEPARTMENT_OTHER)
Admission: EM | Admit: 2019-12-27 | Discharge: 2019-12-28 | Disposition: A | Discharge status: Home / Self Care | Payer: PRIVATE HEALTH INSURANCE | Attending: Emergency Medicine | Admitting: Emergency Medicine

## 2019-12-27 ENCOUNTER — Encounter (HOSPITAL_BASED_OUTPATIENT_CLINIC_OR_DEPARTMENT_OTHER): Payer: Self-pay | Admitting: *Deleted

## 2019-12-27 ENCOUNTER — Other Ambulatory Visit: Payer: Self-pay

## 2019-12-27 DIAGNOSIS — S61412A Laceration without foreign body of left hand, initial encounter: Secondary | ICD-10-CM | POA: Insufficient documentation

## 2019-12-27 DIAGNOSIS — Z5321 Procedure and treatment not carried out due to patient leaving prior to being seen by health care provider: Secondary | ICD-10-CM | POA: Diagnosis not present

## 2019-12-27 DIAGNOSIS — S60922A Unspecified superficial injury of left hand, initial encounter: Secondary | ICD-10-CM | POA: Diagnosis present

## 2019-12-27 DIAGNOSIS — X58XXXA Exposure to other specified factors, initial encounter: Secondary | ICD-10-CM | POA: Diagnosis not present

## 2019-12-27 NOTE — ED Triage Notes (Signed)
Lac to left hand , refuses to give any there details, states" I just want it cleaned out"

## 2020-06-09 ENCOUNTER — Emergency Department (HOSPITAL_BASED_OUTPATIENT_CLINIC_OR_DEPARTMENT_OTHER)
Admission: EM | Admit: 2020-06-09 | Discharge: 2020-06-09 | Disposition: A | Discharge status: Home / Self Care | Payer: PRIVATE HEALTH INSURANCE | Attending: Emergency Medicine | Admitting: Emergency Medicine

## 2020-06-09 ENCOUNTER — Encounter (HOSPITAL_BASED_OUTPATIENT_CLINIC_OR_DEPARTMENT_OTHER): Payer: Self-pay | Admitting: *Deleted

## 2020-06-09 ENCOUNTER — Other Ambulatory Visit: Payer: Self-pay

## 2020-06-09 DIAGNOSIS — R112 Nausea with vomiting, unspecified: Secondary | ICD-10-CM | POA: Insufficient documentation

## 2020-06-09 DIAGNOSIS — R0602 Shortness of breath: Secondary | ICD-10-CM | POA: Insufficient documentation

## 2020-06-09 DIAGNOSIS — Z87891 Personal history of nicotine dependence: Secondary | ICD-10-CM | POA: Insufficient documentation

## 2020-06-09 NOTE — Discharge Instructions (Signed)
Your lung sounds are clear today, your vitals show a normal O2 saturation.  Recommend recheck with your doctor for any concerning symptoms.

## 2020-06-09 NOTE — ED Provider Notes (Signed)
[facility] EMERGENCY DEPARTMENT Provider Note   CSN: [id number] Arrival date & time: 06/09/20  1408     History Chief Complaint  Patient presents with  . Emesis    Barry Lester is a 30 y.o. male.  30 year old male presents with request for lung exam.  Patient states that he threw up 3 days ago and then had shortness of breath.  He has had no complaints or concerns since that time.  States he did not on his emesis, did not have abdominal pain, has not had fevers.  Patient is otherwise healthy.        Past Medical History:  Diagnosis Date  . Prediabetes     There are no problems to display for this patient.   Past Surgical History:  Procedure Laterality Date  . APPENDECTOMY         No family history on file.  Social History   Tobacco Use  . Smoking status: Former Games developer  . Smokeless tobacco: Never Used  Vaping Use  . Vaping Use: Never used  Substance Use Topics  . Alcohol use: No  . Drug use: No    Home Medications Prior to Admission medications   Medication Sig Start Date End Date Taking? Authorizing Provider  naproxen (NAPROSYN) 500 MG tablet Take 1 tablet (500 mg total) by mouth 2 (two) times daily. 05/20/19   Sabas Sous, MD    Allergies    Patient has no known allergies.  Review of Systems   Review of Systems  Constitutional: Negative for chills and fever.  Respiratory: Negative for shortness of breath.   Cardiovascular: Negative for chest pain.  Gastrointestinal: Negative for abdominal pain, constipation, diarrhea, nausea and vomiting.  Genitourinary: Negative for dysuria.  Musculoskeletal: Negative for arthralgias and myalgias.  Skin: Negative for rash and wound.  Allergic/Immunologic: Negative for immunocompromised state.  Neurological: Negative for weakness.  All other systems reviewed and are negative.   Physical Exam Updated Vital Signs BP (!) 131/100 (BP Location: Left Arm)   Pulse 62   Temp 99.1 F (37.3 C)  (Oral)   Resp 16   Ht 5\' 6"  (1.676 m)   Wt 63.5 kg   SpO2 100%   BMI 22.60 kg/m   Physical Exam Vitals and nursing note reviewed.  Constitutional:      General: He is not in acute distress.    Appearance: He is well-developed. He is not diaphoretic.  HENT:     Head: Normocephalic and atraumatic.  Cardiovascular:     Rate and Rhythm: Normal rate and regular rhythm.     Heart sounds: Normal heart sounds.  Pulmonary:     Effort: Pulmonary effort is normal.     Breath sounds: Normal breath sounds.  Chest:     Chest wall: No tenderness.  Abdominal:     Palpations: Abdomen is soft.     Tenderness: There is no abdominal tenderness.  Musculoskeletal:     Right lower leg: No edema.     Left lower leg: No edema.  Skin:    General: Skin is warm and dry.     Findings: No erythema or rash.  Neurological:     Mental Status: He is alert and oriented to person, place, and time.  Psychiatric:        Behavior: Behavior normal.     ED Results / Procedures / Treatments   Labs (all labs ordered are listed, but only abnormal results are displayed) Labs Reviewed - No data  to display  EKG None  Radiology No results found.  Procedures Procedures   Medications Ordered in ED Medications - No data to display  ED Course  I have reviewed the triage vital signs and the nursing notes.  Pertinent labs & imaging results that were available during my care of the patient were reviewed by me and considered in my medical decision making (see chart for details).  Clinical Course as of 06/09/20 1435  Tue Jun 09, 2020  3418 30 year old male requesting lung exam.  Patient states that he vomited 3 days ago and then felt he could not catch his breath after that.  Episode was self-limiting, has not had any symptoms for the past 3 days.  Abdomen is soft and nontender, lungs are clear.  Blood pressure is mildly elevated 131/100, O2 sat normal at 100% on room air.  Offered reassurance, advised  follow-up with primary care should he have any further concerns. [LM]    Clinical Course User Index [LM] Alden Hipp   MDM Rules/Calculators/A&P                          Final Clinical Impression(s) / ED Diagnoses Final diagnoses:  Non-intractable vomiting with nausea, unspecified vomiting type    Rx / DC Orders ED Discharge Orders    None       Jeannie Fend, PA-C 06/09/20 1435    Terald Sleeper, MD 06/09/20 1821

## 2020-06-09 NOTE — ED Triage Notes (Signed)
3 days ago he ate spicy noodles and vomited afterward. States while vomiting he could not catch his breath. No resp distress, sob or difficulty breathing at triage. States he wants to see why he couldn't breathe while vomiting.

## 2020-07-29 ENCOUNTER — Other Ambulatory Visit: Payer: Self-pay

## 2020-07-29 ENCOUNTER — Emergency Department (HOSPITAL_BASED_OUTPATIENT_CLINIC_OR_DEPARTMENT_OTHER)
Admission: EM | Admit: 2020-07-29 | Discharge: 2020-07-29 | Disposition: A | Discharge status: Home / Self Care | Payer: Self-pay | Attending: Emergency Medicine | Admitting: Emergency Medicine

## 2020-07-29 ENCOUNTER — Encounter (HOSPITAL_BASED_OUTPATIENT_CLINIC_OR_DEPARTMENT_OTHER): Payer: Self-pay | Admitting: Emergency Medicine

## 2020-07-29 DIAGNOSIS — Z20822 Contact with and (suspected) exposure to covid-19: Secondary | ICD-10-CM | POA: Insufficient documentation

## 2020-07-29 DIAGNOSIS — Z87891 Personal history of nicotine dependence: Secondary | ICD-10-CM | POA: Insufficient documentation

## 2020-07-29 DIAGNOSIS — B349 Viral infection, unspecified: Secondary | ICD-10-CM

## 2020-07-29 LAB — URINALYSIS, ROUTINE W REFLEX MICROSCOPIC
Bilirubin Urine: NEGATIVE
Glucose, UA: NEGATIVE mg/dL
Hgb urine dipstick: NEGATIVE
Ketones, ur: NEGATIVE mg/dL
Leukocytes,Ua: NEGATIVE
Nitrite: NEGATIVE
Protein, ur: NEGATIVE mg/dL
Specific Gravity, Urine: >1.03 — ABNORMAL HIGH (ref 1.005–1.030)
pH: 6 (ref 5.0–8.0)

## 2020-07-29 LAB — RESP PANEL BY RT-PCR (FLU A&B, COVID) ARPGX2
Influenza A by PCR: NEGATIVE
Influenza B by PCR: NEGATIVE
SARS Coronavirus 2 by RT PCR: NEGATIVE

## 2020-07-29 MED ORDER — ACETAMINOPHEN 325 MG PO TABS
650.0000 mg | ORAL_TABLET | Freq: Once | ORAL | Status: AC
  Administered 2020-07-29: 08:00:00 650 mg via ORAL
  Filled 2020-07-29: qty 2

## 2020-07-29 MED ORDER — KETOROLAC TROMETHAMINE 60 MG/2ML IM SOLN
30.0000 mg | Freq: Once | INTRAMUSCULAR | Status: DC
  Filled 2020-07-29: qty 2

## 2020-07-29 NOTE — ED Provider Notes (Signed)
[facility] EMERGENCY DEPARTMENT Provider Note   CSN: [id number] Arrival date & time: 07/29/20  2703     History Chief Complaint  Patient presents with   Sore Throat    Barry Lester is a 30 y.o. male.  Symptoms for 2 days.  Has been having sore throat primarily.  Also states that having some aching in his back.  Aching and some other muscles 2.  No difficulty swallowing.  No fevers or chills.  Mild cough but no congestion.  No difficulty in breathing, no chest pain.  HPI     Past Medical History:  Diagnosis Date   Prediabetes     There are no problems to display for this patient.   Past Surgical History:  Procedure Laterality Date   APPENDECTOMY         No family history on file.  Social History   Tobacco Use   Smoking status: Former    Pack years: 0.00   Smokeless tobacco: Never  Vaping Use   Vaping Use: Never used  Substance Use Topics   Alcohol use: No   Drug use: No    Home Medications Prior to Admission medications   Medication Sig Start Date End Date Taking? Authorizing Provider  naproxen (NAPROSYN) 500 MG tablet Take 1 tablet (500 mg total) by mouth 2 (two) times daily. 05/20/19   Sabas Sous, MD    Allergies    Patient has no known allergies.  Review of Systems   Review of Systems  Constitutional:  Negative for chills and fever.  HENT:  Positive for sore throat. Negative for ear pain.   Eyes:  Negative for pain and visual disturbance.  Respiratory:  Positive for cough. Negative for shortness of breath.   Cardiovascular:  Negative for chest pain and palpitations.  Gastrointestinal:  Negative for abdominal pain and vomiting.  Genitourinary:  Negative for dysuria and hematuria.  Musculoskeletal:  Positive for back pain and myalgias. Negative for arthralgias.  Skin:  Negative for color change and rash.  Neurological:  Negative for seizures and syncope.  All other systems reviewed and are negative.  Physical Exam Updated Vital  Signs BP 122/63 (BP Location: Right Arm)   Pulse 62   Temp (!) 97.5 F (36.4 C) (Oral)   Resp 18   Ht 5\' 6"  (1.676 m)   Wt 63 kg   SpO2 100%   BMI 22.44 kg/m   Physical Exam Vitals and nursing note reviewed.  Constitutional:      Appearance: He is well-developed.  HENT:     Head: Normocephalic and atraumatic.     Mouth/Throat:     Mouth: Mucous membranes are moist.     Pharynx: Oropharynx is clear. No oropharyngeal exudate or posterior oropharyngeal erythema.  Eyes:     Conjunctiva/sclera: Conjunctivae normal.  Cardiovascular:     Rate and Rhythm: Normal rate and regular rhythm.     Heart sounds: No murmur heard. Pulmonary:     Effort: Pulmonary effort is normal. No respiratory distress.     Breath sounds: Normal breath sounds.  Abdominal:     Palpations: Abdomen is soft.     Tenderness: There is no abdominal tenderness.  Musculoskeletal:     Cervical back: Neck supple.  Skin:    General: Skin is warm and dry.  Neurological:     Mental Status: He is alert.    ED Results / Procedures / Treatments   Labs (all labs ordered are listed, but only abnormal  results are displayed) Labs Reviewed  URINALYSIS, ROUTINE W REFLEX MICROSCOPIC - Abnormal; Notable for the following components:      Result Value   Specific Gravity, Urine >1.030 (*)    All other components within normal limits  RESP PANEL BY RT-PCR (FLU A&B, COVID) [id number]    EKG None  Radiology No results found.  Procedures Procedures   Medications Ordered in ED Medications  acetaminophen (TYLENOL) tablet 650 mg (650 mg Oral Given 07/29/20 6761)    ED Course  I have reviewed the triage vital signs and the nursing notes.  Pertinent labs & imaging results that were available during my care of the patient were reviewed by me and considered in my medical decision making (see chart for details).    MDM Rules/Calculators/A&P                          30 year old male presents to ER with concern for sore  throat and body aches and back pain.  On exam patient appears remarkably well-appearing in no distress with normal vital signs.  No significant abnormality noted on throat exam.  Suspect most likely viral process.  UA was negative for infection or blood in urine.  COVID testing, flu testing negative.  Given well appearance, tolerating p.o., recommend supportive care at present.  Discharged home, reviewed return precautions.  After the discussed management above, the patient was determined to be safe for discharge.  The patient was in agreement with this plan and all questions regarding their care were answered.  ED return precautions were discussed and the patient will return to the ED with any significant worsening of condition.  Final Clinical Impression(s) / ED Diagnoses Final diagnoses:  Acute viral disease    Rx / DC Orders ED Discharge Orders     None        Milagros Loll, MD 07/30/20 [phone]

## 2020-07-29 NOTE — ED Triage Notes (Signed)
Pt reports sore throat and back pain that started 2 days ago.

## 2020-07-29 NOTE — Discharge Instructions (Signed)
Please check MyChart for results.  If your COVID test is positive, you should follow CDC guidelines regarding isolation precautions.  Take Tylenol or Motrin as needed for pain control.  If you develop any difficulty breathing, abdominal pain, nausea vomiting, fever, or other new concerning symptom, come back to ER for reassessment.

## 2021-05-02 ENCOUNTER — Emergency Department (HOSPITAL_BASED_OUTPATIENT_CLINIC_OR_DEPARTMENT_OTHER)
Admission: EM | Admit: 2021-05-02 | Discharge: 2021-05-02 | Disposition: A | Discharge status: Home / Self Care | Payer: Self-pay | Attending: Emergency Medicine | Admitting: Emergency Medicine

## 2021-05-02 ENCOUNTER — Other Ambulatory Visit: Payer: Self-pay

## 2021-05-02 ENCOUNTER — Encounter (HOSPITAL_BASED_OUTPATIENT_CLINIC_OR_DEPARTMENT_OTHER): Payer: Self-pay

## 2021-05-02 ENCOUNTER — Emergency Department (HOSPITAL_BASED_OUTPATIENT_CLINIC_OR_DEPARTMENT_OTHER): Payer: Self-pay

## 2021-05-02 DIAGNOSIS — D72829 Elevated white blood cell count, unspecified: Secondary | ICD-10-CM | POA: Insufficient documentation

## 2021-05-02 DIAGNOSIS — K529 Noninfective gastroenteritis and colitis, unspecified: Secondary | ICD-10-CM | POA: Insufficient documentation

## 2021-05-02 DIAGNOSIS — Z20822 Contact with and (suspected) exposure to covid-19: Secondary | ICD-10-CM | POA: Insufficient documentation

## 2021-05-02 DIAGNOSIS — Z87891 Personal history of nicotine dependence: Secondary | ICD-10-CM | POA: Insufficient documentation

## 2021-05-02 LAB — CBC WITH DIFFERENTIAL/PLATELET
Abs Immature Granulocytes: 0.03 10*3/uL (ref 0.00–0.07)
Basophils Absolute: 0.1 10*3/uL (ref 0.0–0.1)
Basophils Relative: 0 %
Eosinophils Absolute: 0.3 10*3/uL (ref 0.0–0.5)
Eosinophils Relative: 3 %
HCT: 41.9 % (ref 39.0–52.0)
Hemoglobin: 14.7 g/dL (ref 13.0–17.0)
Immature Granulocytes: 0 %
Lymphocytes Relative: 30 %
Lymphs Abs: 3.7 10*3/uL (ref 0.7–4.0)
MCH: 30.4 pg (ref 26.0–34.0)
MCHC: 35.1 g/dL (ref 30.0–36.0)
MCV: 86.7 fL (ref 80.0–100.0)
Monocytes Absolute: 0.9 10*3/uL (ref 0.1–1.0)
Monocytes Relative: 8 %
Neutro Abs: 7.4 10*3/uL (ref 1.7–7.7)
Neutrophils Relative %: 59 %
Platelets: 261 10*3/uL (ref 150–400)
RBC: 4.83 MIL/uL (ref 4.22–5.81)
RDW: 12.9 % (ref 11.5–15.5)
WBC: 12.4 10*3/uL — ABNORMAL HIGH (ref 4.0–10.5)
nRBC: 0 % (ref 0.0–0.2)

## 2021-05-02 LAB — RESP PANEL BY RT-PCR (FLU A&B, COVID) ARPGX2
Influenza A by PCR: NEGATIVE
Influenza B by PCR: NEGATIVE
SARS Coronavirus 2 by RT PCR: NEGATIVE

## 2021-05-02 LAB — BASIC METABOLIC PANEL
Anion gap: 6 (ref 5–15)
BUN: 10 mg/dL (ref 6–20)
CO2: 26 mmol/L (ref 22–32)
Calcium: 8.7 mg/dL — ABNORMAL LOW (ref 8.9–10.3)
Chloride: 107 mmol/L (ref 98–111)
Creatinine, Ser: 1.29 mg/dL — ABNORMAL HIGH (ref 0.61–1.24)
GFR, Estimated: >60 mL/min (ref >60)
Glucose, Bld: 135 mg/dL — ABNORMAL HIGH (ref 70–99)
Potassium: 3.6 mmol/L (ref 3.5–5.1)
Sodium: 139 mmol/L (ref 135–145)

## 2021-05-02 MED ORDER — ONDANSETRON HCL 8 MG PO TABS
8.0000 mg | ORAL_TABLET | Freq: Three times a day (TID) | ORAL | 0 refills | Status: DC | PRN
Start: 2021-05-02 — End: 2021-11-12

## 2021-05-02 MED ORDER — ONDANSETRON HCL 4 MG/2ML IJ SOLN
4.0000 mg | Freq: Once | INTRAMUSCULAR | Status: AC
  Administered 2021-05-02: 4 mg via INTRAVENOUS
  Filled 2021-05-02: qty 2

## 2021-05-02 MED ORDER — SODIUM CHLORIDE 0.9 % IV BOLUS
1000.0000 mL | Freq: Once | INTRAVENOUS | Status: AC
  Administered 2021-05-02: 1000 mL via INTRAVENOUS

## 2021-05-02 NOTE — ED Triage Notes (Signed)
Pt to ED by POV from home with c/o NVD which began yesterday, pt also endorses left sided lower rib pain. Pt arrives A+O, VSS, NADN. ?

## 2021-05-02 NOTE — ED Provider Notes (Signed)
? ?[facility] ?Provider Note: Barry Dell, MD, FACEP ? ?CSN: [id number] ?MRN: [id number] ?ARRIVAL: 05/02/21 at 0151 ?ROOM: [facility] ? ? ?CHIEF COMPLAINT  ?Vomiting ? ? ?HISTORY OF PRESENT ILLNESS  ?05/02/21 2:12 AM ?Barry Lester is a 31 y.o. male with nausea, vomiting and diarrhea which began yesterday morning.  He has not vomited since yesterday evening about 6 PM and he has had no diarrhea this morning.  He has had no abdominal pain with this.  He is having left lower chest pain.  The pain is sharp and occurs, only sometimes, when he takes a deep breath.  It is sharp and only lasts a brief time, like a quick needlestick.  He rates it as about a 7 out of 10.  It does not hurt when he coughs.  He was recently exposed to COVID. ? ? ?Past Medical History:  ?Diagnosis Date  ? Prediabetes   ? ? ?Past Surgical History:  ?Procedure Laterality Date  ? APPENDECTOMY    ? ? ?No family history on file. ? ?Social History  ? ?Tobacco Use  ? Smoking status: Former  ? Smokeless tobacco: Never  ?Vaping Use  ? Vaping Use: Never used  ?Substance Use Topics  ? Alcohol use: No  ? Drug use: No  ? ? ?Prior to Admission medications   ?Medication Sig Start Date End Date Taking? Authorizing Provider  ?ondansetron (ZOFRAN) 8 MG tablet Take 1 tablet (8 mg total) by mouth every 8 (eight) hours as needed for nausea or vomiting. 05/02/21  Yes Carder Yin, Jonny Ruiz, MD  ? ? ?Allergies ?Patient has no known allergies. ? ? ?REVIEW OF SYSTEMS  ?Negative except as noted here or in the History of Present Illness. ? ? ?PHYSICAL EXAMINATION  ?Initial Vital Signs ?Blood pressure 132/78, pulse 80, temperature 98.4 ?F (36.9 ?C), temperature source Oral, resp. rate 19, height 5\' 6"  (1.676 m), weight 63 kg, SpO2 100 %. ? ?Examination ?General: Well-developed, well-nourished male in no acute distress; appearance consistent with age of record ?HENT: normocephalic; atraumatic ?Eyes: Normal appearance ?Neck: supple ?Heart: regular rate and rhythm ?Lungs:  clear to auscultation bilaterally ?Chest: Nontender ?Abdomen: soft; nondistended; nontender; bowel sounds present ?Extremities: No deformity; full range of motion; pulses normal ?Neurologic: Awake, alert and oriented; motor function intact in all extremities and symmetric; no facial droop ?Skin: Warm and dry ?Psychiatric: Normal mood and affect ? ? ?RESULTS  ?Summary of this visit's results, reviewed and interpreted by myself: ? ? EKG Interpretation ? ?Date/Time:  Sunday May 02 2021 01:58:25 EDT ?Ventricular Rate:  65 ?PR Interval:  137 ?QRS Duration: 85 ?QT Interval:  388 ?QTC Calculation: 404 ?R Axis:   93 ?Text Interpretation: Sinus rhythm Borderline right axis deviation No significant change was found Confirmed by Denard Tuminello (1982) on 05/02/2021 3:11:05 AM ?  ? ?  ? ?Laboratory Studies: ?Results for orders placed or performed during the hospital encounter of 05/02/21 (from the past 24 hour(s))  ?CBC with Differential/Platelet     Status: Abnormal  ? Collection Time: 05/02/21  2:20 AM  ?Result Value Ref Range  ? WBC 12.4 (H) 4.0 - 10.5 K/uL  ? RBC 4.83 4.22 - 5.81 MIL/uL  ? Hemoglobin 14.7 13.0 - 17.0 g/dL  ? HCT 41.9 39.0 - 52.0 %  ? MCV 86.7 80.0 - 100.0 fL  ? MCH 30.4 26.0 - 34.0 pg  ? MCHC 35.1 30.0 - 36.0 g/dL  ? RDW 12.9 11.5 - 15.5 %  ? Platelets 261 150 - 400  K/uL  ? nRBC 0.0 0.0 - 0.2 %  ? Neutrophils Relative % 59 %  ? Neutro Abs 7.4 1.7 - 7.7 K/uL  ? Lymphocytes Relative 30 %  ? Lymphs Abs 3.7 0.7 - 4.0 K/uL  ? Monocytes Relative 8 %  ? Monocytes Absolute 0.9 0.1 - 1.0 K/uL  ? Eosinophils Relative 3 %  ? Eosinophils Absolute 0.3 0.0 - 0.5 K/uL  ? Basophils Relative 0 %  ? Basophils Absolute 0.1 0.0 - 0.1 K/uL  ? Immature Granulocytes 0 %  ? Abs Immature Granulocytes 0.03 0.00 - 0.07 K/uL  ?Basic metabolic panel     Status: Abnormal  ? Collection Time: 05/02/21  2:20 AM  ?Result Value Ref Range  ? Sodium 139 135 - 145 mmol/L  ? Potassium 3.6 3.5 - 5.1 mmol/L  ? Chloride 107 98 - 111 mmol/L  ? CO2 26  22 - 32 mmol/L  ? Glucose, Bld 135 (H) 70 - 99 mg/dL  ? BUN 10 6 - 20 mg/dL  ? Creatinine, Ser 1.29 (H) 0.61 - 1.24 mg/dL  ? Calcium 8.7 (L) 8.9 - 10.3 mg/dL  ? GFR, Estimated >60 >60 mL/min  ? Anion gap 6 5 - 15  ?Resp Panel by RT-PCR (Flu A&B, Covid) Nasopharyngeal Swab     Status: None  ? Collection Time: 05/02/21  2:22 AM  ? Specimen: Nasopharyngeal Swab; Nasopharyngeal(NP) swabs in vial transport medium  ?Result Value Ref Range  ? SARS Coronavirus 2 by RT PCR NEGATIVE NEGATIVE  ? Influenza A by PCR NEGATIVE NEGATIVE  ? Influenza B by PCR NEGATIVE NEGATIVE  ? ?Imaging Studies: ?DG Ribs Unilateral W/Chest Left ? ?Result Date: 05/02/2021 ?CLINICAL DATA:  Left-sided chest pain. EXAM: LEFT RIBS AND CHEST - 3+ VIEW COMPARISON:  October 26, 2020 FINDINGS: A radiopaque marker was placed at the site of the patient's pain. No fracture or other bone lesions are seen involving the ribs. There is no evidence of pneumothorax or pleural effusion. Both lungs are clear. Heart size and mediastinal contours are within normal limits. IMPRESSION: Negative. Electronically Signed   By: Aram Candela M.D.   On: 05/02/2021 02:59   ? ?ED COURSE and MDM  ?Nursing notes, initial and subsequent vitals signs, including pulse oximetry, reviewed and interpreted by myself. ? ?Vitals:  ? 05/02/21 0204 05/02/21 0209  ?BP: 132/78   ?Pulse: 80   ?Resp: 19   ?Temp: 98.4 ?F (36.9 ?C)   ?TempSrc: Oral   ?SpO2: 100%   ?Weight:  63 kg  ?Height:  5\' 6"  (1.676 m)  ? ?Medications  ?sodium chloride 0.9 % bolus 1,000 mL (1,000 mLs Intravenous New Bag/Given 05/02/21 0245)  ?ondansetron (ZOFRAN) injection 4 mg (4 mg Intravenous Given 05/02/21 0245)  ? ?3:09 AM ?Patient's presentation is most consistent with GI virus given symptoms and mild leukocytosis in the absence of significant abdominal pain.  The cause of his chest pain is unclear but it is atypical for cardiac and his EKG is unchanged from prior studies.  There is a slight pleuritic component to his chest  pain but he has no risk factors for thromboembolic disease. ? ? ?PROCEDURES  ?Procedures ? ? ?ED DIAGNOSES  ? ?  ICD-10-CM   ?1. Gastroenteritis  K52.9   ?  ? ? ? ?  ?07/02/21, MD ?05/02/21 [phone] ? ?

## 2021-06-01 ENCOUNTER — Encounter (HOSPITAL_BASED_OUTPATIENT_CLINIC_OR_DEPARTMENT_OTHER): Payer: Self-pay | Admitting: Emergency Medicine

## 2021-06-01 ENCOUNTER — Emergency Department (HOSPITAL_BASED_OUTPATIENT_CLINIC_OR_DEPARTMENT_OTHER)
Admission: EM | Admit: 2021-06-01 | Discharge: 2021-06-01 | Disposition: A | Discharge status: Home / Self Care | Payer: Self-pay | Attending: Emergency Medicine | Admitting: Emergency Medicine

## 2021-06-01 ENCOUNTER — Other Ambulatory Visit: Payer: Self-pay

## 2021-06-01 DIAGNOSIS — M791 Myalgia, unspecified site: Secondary | ICD-10-CM | POA: Insufficient documentation

## 2021-06-01 DIAGNOSIS — S199XXA Unspecified injury of neck, initial encounter: Secondary | ICD-10-CM | POA: Insufficient documentation

## 2021-06-01 DIAGNOSIS — X58XXXA Exposure to other specified factors, initial encounter: Secondary | ICD-10-CM | POA: Insufficient documentation

## 2021-06-01 DIAGNOSIS — R599 Enlarged lymph nodes, unspecified: Secondary | ICD-10-CM | POA: Insufficient documentation

## 2021-06-01 NOTE — Discharge Instructions (Signed)
You came to the emergency department today to be evaluated for your neck pain.  Your physical exam showed that your superficial cervical lymph node was slightly enlarged and tender.  Please use Tylenol or ibuprofen as indicated below to help with your pain.  If your symptoms are not better by next week please follow-up with your primary care doctor or urgent care for repeat evaluation. ? ?Please take Ibuprofen (Advil, motrin) and Tylenol (acetaminophen) to relieve your pain.   ? ?You may take up to 600 MG (3 pills) of normal strength ibuprofen every 8 hours as needed.   ?You make take tylenol, up to 1,000 mg (two extra strength pills) every 8 hours as needed.  ? ?It is safe to take ibuprofen and tylenol at the same time as they work differently.  ? Do not take more than 3,000 mg tylenol in a 24 hour period (not more than one dose every 8 hours.  Please check all medication labels as many medications such as pain and cold medications may contain tylenol.  Do not drink alcohol while taking these medications.  Do not take other NSAID'S while taking ibuprofen (such as aleve or naproxen).  Please take ibuprofen with food to decrease stomach upset. ? ? ?Return to the emergency department if: ?-You develop significant swelling to your neck ?-You develop neck stiffness where you cannot turn her neck side to side or up and down ?-You are unable to swallow liquids ?-You develop significant shortness of breath or difficulty breathing ?-You develop fevers ?

## 2021-06-01 NOTE — ED Provider Notes (Signed)
?[facility] EMERGENCY DEPARTMENT ?Provider Note ? ? ?CSN: [id number] ?Arrival date & time: 06/01/21  0954 ? ?  ? ?History ? ?Chief Complaint  ?Patient presents with  ? Neck Injury  ? ? ?Barry Lester is a 31 y.o. male with past medical history of prediabetes, status post appendectomy.  Presents to the emergency department with a chief complaint of neck pain.  Patient reports that the left side of his neck has felt swollen and been painful for the last 3 days.  Patient reports that pain is constant however waxes and wanes in intensity.  Pain is worse with talking, touch, and when he moves his neck.  Patient denies any known injuries.  Denies any IV drug use. ? ?Denies any fever, chills, trismus, drooling, hot potato voice, neck stiffness, facial swelling, cough, shortness of breath, numbness, weakness.   ? ? ?Neck Injury ?Pertinent negatives include no chest pain, no abdominal pain, no headaches and no shortness of breath.  ? ?  ? ?Home Medications ?Prior to Admission medications   ?Medication Sig Start Date End Date Taking? Authorizing Provider  ?ondansetron (ZOFRAN) 8 MG tablet Take 1 tablet (8 mg total) by mouth every 8 (eight) hours as needed for nausea or vomiting. 05/02/21   Molpus, Jonny Ruiz, MD  ?   ? ?Allergies    ?Patient has no known allergies.   ? ?Review of Systems   ?Review of Systems  ?Constitutional:  Negative for chills and fever.  ?HENT:  Negative for congestion, drooling, sore throat, trouble swallowing and voice change.   ?Eyes:  Negative for visual disturbance.  ?Respiratory:  Negative for cough and shortness of breath.   ?Cardiovascular:  Negative for chest pain.  ?Gastrointestinal:  Negative for abdominal pain, nausea and vomiting.  ?Musculoskeletal:  Positive for neck pain. Negative for back pain and neck stiffness.  ?Skin:  Negative for color change and rash.  ?Neurological:  Negative for dizziness, syncope, weakness, light-headedness, numbness and headaches.  ?Psychiatric/Behavioral:   Negative for confusion.   ? ?Physical Exam ?Updated Vital Signs ?BP 134/79 (BP Location: Right Arm)   Pulse 60   Temp 97.7 ?F (36.5 ?C) (Oral)   Resp 18   Ht 5\' 6"  (1.676 m)   Wt 63.5 kg   SpO2 100%   BMI 22.60 kg/m?  ?Physical Exam ?Vitals and nursing note reviewed.  ?Constitutional:   ?   General: He is not in acute distress. ?   Appearance: He is not ill-appearing, toxic-appearing or diaphoretic.  ?HENT:  ?   Head: Normocephalic. No right periorbital erythema or left periorbital erythema.  ?   Jaw: No trismus, tenderness, swelling, pain on movement or malocclusion.  ?   Salivary Glands: Right salivary gland is not diffusely enlarged or tender. Left salivary gland is not diffusely enlarged or tender.  ?   Mouth/Throat:  ?   Lips: Pink. No lesions.  ?   Mouth: Mucous membranes are moist.  ?   Dentition: No dental tenderness or dental abscesses.  ?   Tongue: No lesions. Tongue does not deviate from midline.  ?   Palate: No mass and lesions.  ?   Pharynx: Oropharynx is clear. Uvula midline. No pharyngeal swelling, oropharyngeal exudate, posterior oropharyngeal erythema or uvula swelling.  ?   Tonsils: No tonsillar exudate or tonsillar abscesses. 1+ on the right. 1+ on the left.  ?   Comments: Handles oral secretions without difficulty ?Eyes:  ?   General: No scleral icterus.    ?  Right eye: No discharge.     ?   Left eye: No discharge.  ?Neck:  ?   Comments: Swelling and tenderness to left superficial cervical lymph node ?Cardiovascular:  ?   Rate and Rhythm: Normal rate.  ?Pulmonary:  ?   Effort: Pulmonary effort is normal. No tachypnea, bradypnea or respiratory distress.  ?   Breath sounds: Normal breath sounds. No stridor.  ?Musculoskeletal:  ?   Cervical back: Full passive range of motion without pain, normal range of motion and neck supple. No edema, erythema, signs of trauma, rigidity, torticollis or crepitus. Muscular tenderness present. No pain with movement or spinous process tenderness. Normal  range of motion.  ?Lymphadenopathy:  ?   Cervical: Cervical adenopathy present.  ?   Right cervical: No superficial, deep or posterior cervical adenopathy. ?   Left cervical: Superficial cervical adenopathy present. No deep or posterior cervical adenopathy.  ?Skin: ?   General: Skin is warm and dry.  ?Neurological:  ?   General: No focal deficit present.  ?   Mental Status: He is alert.  ?Psychiatric:     ?   Behavior: Behavior is cooperative.  ? ? ?ED Results / Procedures / Treatments   ?Labs ?(all labs ordered are listed, but only abnormal results are displayed) ?Labs Reviewed - No data to display ? ?EKG ?None ? ?Radiology ?No results found. ? ?Procedures ?Procedures  ? ? ?Medications Ordered in ED ?Medications - No data to display ? ?ED Course/ Medical Decision Making/ A&P ?  ?                        ?Medical Decision Making ? ?Alert 31 year old male in no acute distress, nontoxic-appearing.  Presents to the emergency department with a chief complaint of left-sided neck pain. ? ?Information obtained from patient.  Past medical records reviewed including previous provider notes and labs. ? ?On physical exam patient noted to have minimal swelling and tenderness to left superficial cervical lymph node.   ? ?Low suspicion for deep space neck infection or Ludwick's angina as there is no swelling to submandibular space, pain with passive range of motion of neck.  Low suspicion for pharyngitis/tonsillitis as no swelling, exudate, or erythema to tonsils bilaterally or oropharynx.  Low suspicion for meningitis as patient is afebrile with full range of motion of neck. ? ?We will treat patient with over-the-counter pain management.  Patient to follow-up with PCP/urgent care next week if symptoms do not improve.  Discussed strict return precautions with patient.  Patient is agreeable with this plan. ? ?Based on patient's chief complaint, I considered admission might be necessary, however after reassuring ED workup feel  patient is reasonable for discharge.  Discussed results, findings, treatment and follow up. Patient advised of return precautions. Patient verbalized understanding and agreed with plan. ? ?Portions of this note were generated with Scientist, clinical (histocompatibility and immunogenetics). Dictation errors may occur despite best attempts at proofreading. ? ? ? ? ? ? ? ? ?Final Clinical Impression(s) / ED Diagnoses ?Final diagnoses:  ?None  ? ? ?Rx / DC Orders ?ED Discharge Orders   ? ? None  ? ?  ? ? ?  ?Haskel Schroeder, PA-C ?06/01/21 1038 ? ?  ?Tegeler, Canary Brim, MD ?06/01/21 1321 ? ?

## 2021-06-01 NOTE — ED Triage Notes (Signed)
Reports left neck pain x 3 days , denies cold symptoms , tender to touch  ?

## 2021-06-27 ENCOUNTER — Emergency Department (HOSPITAL_BASED_OUTPATIENT_CLINIC_OR_DEPARTMENT_OTHER)
Admission: EM | Admit: 2021-06-27 | Discharge: 2021-06-27 | Disposition: A | Discharge status: Home / Self Care | Payer: Self-pay | Attending: Emergency Medicine | Admitting: Emergency Medicine

## 2021-06-27 ENCOUNTER — Emergency Department (HOSPITAL_BASED_OUTPATIENT_CLINIC_OR_DEPARTMENT_OTHER): Payer: Self-pay

## 2021-06-27 ENCOUNTER — Encounter (HOSPITAL_BASED_OUTPATIENT_CLINIC_OR_DEPARTMENT_OTHER): Payer: Self-pay | Admitting: Emergency Medicine

## 2021-06-27 DIAGNOSIS — M79641 Pain in right hand: Secondary | ICD-10-CM | POA: Insufficient documentation

## 2021-06-27 DIAGNOSIS — M79644 Pain in right finger(s): Secondary | ICD-10-CM | POA: Insufficient documentation

## 2021-06-27 NOTE — ED Triage Notes (Signed)
Pt c/o RT thumb pain after falling on it today; difficulty moving it

## 2021-06-27 NOTE — ED Provider Notes (Signed)
[facility] Provider Note   CSN: [id number] Arrival date & time: 06/27/21  2037    History  Chief Complaint  Patient presents with   Hand Injury    Barry Lester is a 31 y.o. male with no significant past medical history here for evaluation of right thumb pain.  Patient states had mechanical fall earlier today and landed jammed his right thumb.  States he has had pain from his distal thumb all the way into mid thenar eminence.  Pain worse with range of motion.  He has no pain to his scaphoid, forearm.  No redness, swelling, warmth.  No abrasions, lacerations.  No nailbed damage.  No meds PTA.  HPI     Home Medications Prior to Admission medications   Medication Sig Start Date End Date Taking? Authorizing Provider  ondansetron (ZOFRAN) 8 MG tablet Take 1 tablet (8 mg total) by mouth every 8 (eight) hours as needed for nausea or vomiting. 05/02/21   Molpus, Jenny Reichmann, MD      Allergies    Patient has no known allergies.    Review of Systems   Review of Systems  Constitutional: Negative.   HENT: Negative.    Respiratory: Negative.    Cardiovascular: Negative.   Gastrointestinal: Negative.   Genitourinary: Negative.   Musculoskeletal:        Right thumb pain  Skin: Negative.   Neurological: Negative.   All other systems reviewed and are negative.  Physical Exam Updated Vital Signs BP 140/87   Pulse 64   Temp 98.1 F (36.7 C) (Oral)   Resp 16   Ht 5\' 6"  (1.676 m)   Wt 63.5 kg   SpO2 99%   BMI 22.60 kg/m  Physical Exam Vitals and nursing note reviewed.  Constitutional:      General: He is not in acute distress.    Appearance: He is well-developed. He is not ill-appearing, toxic-appearing or diaphoretic.  HENT:     Head: Atraumatic.  Eyes:     Pupils: Pupils are equal, round, and reactive to light.  Cardiovascular:     Rate and Rhythm: Normal rate and regular rhythm.     Pulses: Normal pulses.          Radial pulses are 2+ on the  right side and 2+ on the left side.  Pulmonary:     Effort: Pulmonary effort is normal. No respiratory distress.  Abdominal:     General: There is no distension.     Palpations: Abdomen is soft.  Musculoskeletal:        General: Normal range of motion.       Hands:     Cervical back: Normal range of motion and neck supple.     Comments: Nontender right scaphoid, forearm.  Diffuse tenderness distal thumb through to mid thenar eminence.  Flex and extend at DIP, PIP.    Skin:    General: Skin is warm and dry.     Capillary Refill: Capillary refill takes less than 2 seconds.     Comments: No nailbed damage No edema, erythema or warmth. No fusiform swelling  Neurological:     General: No focal deficit present.     Mental Status: He is alert and oriented to person, place, and time.     Sensory: Sensation is intact.     Comments: Intact sensation Equal strength   ED Results / Procedures / Treatments   Labs (all labs ordered are listed, but only abnormal results  are displayed) Labs Reviewed - No data to display  EKG None  Radiology DG Hand Complete Right  Result Date: 06/27/2021 CLINICAL DATA:  Fall with right hand pain.  Pain in the thumb. EXAM: RIGHT HAND - COMPLETE 3+ VIEW COMPARISON:  None Available. FINDINGS: There is no evidence of fracture or dislocation. Particularly, no fracture of the thumb. There is no evidence of arthropathy or other focal bone abnormality. Soft tissues are unremarkable. IMPRESSION: Negative radiographs of the right hand. Electronically Signed   By: Keith Rake M.D.   On: 06/27/2021 21:17    Procedures .Splint Application  Date/Time: 06/27/2021 10:00 PM Performed by: Nettie Elm, PA-C Authorized by: Nettie Elm, PA-C   Consent:    Consent obtained:  Verbal   Consent given by:  Patient   Risks, benefits, and alternatives were discussed: yes     Risks discussed:  Discoloration, numbness, pain and swelling   Alternatives discussed:   No treatment, delayed treatment, alternative treatment, observation and referral Universal protocol:    Procedure explained and questions answered to patient or proxy's satisfaction: yes     Relevant documents present and verified: yes     Test results available: yes     Imaging studies available: yes     Required blood products, implants, devices, and special equipment available: yes     Site/side marked: yes     Immediately prior to procedure a time out was called: yes     Patient identity confirmed:  Verbally with patient Pre-procedure details:    Distal neurologic exam:  Normal   Distal perfusion: distal pulses strong   Procedure details:    Location:  Hand   Hand location:  R hand   Strapping: no     Splint type:  Thumb spica   Supplies:  Prefabricated splint   Attestation: Splint applied and adjusted personally by me   Post-procedure details:    Distal neurologic exam:  Normal   Distal perfusion: distal pulses strong and brisk capillary refill     Procedure completion:  Tolerated well, no immediate complications   Post-procedure imaging: not applicable      Medications Ordered in ED Medications - No data to display  ED Course/ Medical Decision Making/ A&P    31 year old here for evaluation of right thumb pain after jamming his thumb earlier today.  Patient has diffuse tenderness distal thumb to mid thenar eminence.  Full range of motion.  Neurovascularly intact.  Non tender at scaphoid.  No overlying skin changes to suggest infectious process.  Imaging personally viewed and interpreted:  Right hand xray without fracture, dislocation  Discussed results with patient.  Suspect MSK sprain or strain.  He was placed in thumb spica splint for comfort.  Discussed ice, elevation, NSAIDs.  Encourage follow-up with orthopedics if symptoms do not improve after rest.  Low suspicion for infectious process, VTE, occult fracture, dislocation  The patient has been appropriately  medically screened and/or stabilized in the ED. I have low suspicion for any other emergent medical condition which would require further screening, evaluation or treatment in the ED or require inpatient management.  Patient is hemodynamically stable and in no acute distress.  Patient able to ambulate in department prior to ED.  Evaluation does not show acute pathology that would require ongoing or additional emergent interventions while in the emergency department or further inpatient treatment.  I have discussed the diagnosis with the patient and answered all questions.  Pain is been managed while  in the emergency department and patient has no further complaints prior to discharge.  Patient is comfortable with plan discussed in room and is stable for discharge at this time.  I have discussed strict return precautions for returning to the emergency department.  Patient was encouraged to follow-up with PCP/specialist refer to at discharge.                            Medical Decision Making Amount and/or Complexity of Data Reviewed External Data Reviewed: labs, radiology and notes. Radiology: ordered and independent interpretation performed. Decision-making details documented in ED Course.  Risk OTC drugs. Prescription drug management. Diagnosis or treatment significantly limited by social determinants of health.           Final Clinical Impression(s) / ED Diagnoses Final diagnoses:  Pain of right hand    Rx / DC Orders ED Discharge Orders     None         Tressia Labrum A, PA-C [id number] [phone]    Campbell Stall P, DO [id number]

## 2021-06-27 NOTE — Discharge Instructions (Signed)
Keep the brace on for a few days.  May take Tylenol or Motrin as needed for pain  Make sure to ice and elevate.  Follow up with orthopedics if your symptoms do not improve

## 2021-11-12 ENCOUNTER — Other Ambulatory Visit: Payer: Self-pay

## 2021-11-12 ENCOUNTER — Encounter (HOSPITAL_BASED_OUTPATIENT_CLINIC_OR_DEPARTMENT_OTHER): Payer: Self-pay | Admitting: Emergency Medicine

## 2021-11-12 ENCOUNTER — Emergency Department (HOSPITAL_BASED_OUTPATIENT_CLINIC_OR_DEPARTMENT_OTHER)
Admission: EM | Admit: 2021-11-12 | Discharge: 2021-11-12 | Disposition: A | Discharge status: Home / Self Care | Payer: Self-pay | Attending: Emergency Medicine | Admitting: Emergency Medicine

## 2021-11-12 DIAGNOSIS — I10 Essential (primary) hypertension: Secondary | ICD-10-CM | POA: Insufficient documentation

## 2021-11-12 DIAGNOSIS — R1084 Generalized abdominal pain: Secondary | ICD-10-CM

## 2021-11-12 DIAGNOSIS — J069 Acute upper respiratory infection, unspecified: Secondary | ICD-10-CM | POA: Insufficient documentation

## 2021-11-12 HISTORY — DX: Essential (primary) hypertension: I10

## 2021-11-12 LAB — URINALYSIS, ROUTINE W REFLEX MICROSCOPIC
Bilirubin Urine: NEGATIVE
Glucose, UA: NEGATIVE mg/dL
Hgb urine dipstick: NEGATIVE
Ketones, ur: NEGATIVE mg/dL
Leukocytes,Ua: NEGATIVE
Nitrite: NEGATIVE
Protein, ur: NEGATIVE mg/dL
Specific Gravity, Urine: 1.015 (ref 1.005–1.030)
pH: 8.5 — ABNORMAL HIGH (ref 5.0–8.0)

## 2021-11-12 LAB — CBC
HCT: 47.6 % (ref 39.0–52.0)
Hemoglobin: 16.1 g/dL (ref 13.0–17.0)
MCH: 29.9 pg (ref 26.0–34.0)
MCHC: 33.8 g/dL (ref 30.0–36.0)
MCV: 88.3 fL (ref 80.0–100.0)
Platelets: 291 10*3/uL (ref 150–400)
RBC: 5.39 MIL/uL (ref 4.22–5.81)
RDW: 13.1 % (ref 11.5–15.5)
WBC: 13.3 10*3/uL — ABNORMAL HIGH (ref 4.0–10.5)
nRBC: 0 % (ref 0.0–0.2)

## 2021-11-12 LAB — COMPREHENSIVE METABOLIC PANEL
ALT: 22 U/L (ref 0–44)
AST: 20 U/L (ref 15–41)
Albumin: 4.6 g/dL (ref 3.5–5.0)
Alkaline Phosphatase: 54 U/L (ref 38–126)
Anion gap: 6 (ref 5–15)
BUN: 13 mg/dL (ref 6–20)
CO2: 26 mmol/L (ref 22–32)
Calcium: 9 mg/dL (ref 8.9–10.3)
Chloride: 106 mmol/L (ref 98–111)
Creatinine, Ser: 0.99 mg/dL (ref 0.61–1.24)
GFR, Estimated: >60 mL/min (ref >60)
Glucose, Bld: 110 mg/dL — ABNORMAL HIGH (ref 70–99)
Potassium: 4.2 mmol/L (ref 3.5–5.1)
Sodium: 138 mmol/L (ref 135–145)
Total Bilirubin: 1 mg/dL (ref 0.3–1.2)
Total Protein: 8 g/dL (ref 6.5–8.1)

## 2021-11-12 LAB — LIPASE, BLOOD: Lipase: 29 U/L (ref 11–51)

## 2021-11-12 MED ORDER — ONDANSETRON 4 MG PO TBDP: 4.0000 mg | ORAL_TABLET | Freq: Three times a day (TID) | ORAL | 0 refills | Status: DC | PRN

## 2021-11-12 MED ORDER — DICYCLOMINE HCL 20 MG PO TABS: 20.0000 mg | ORAL_TABLET | Freq: Two times a day (BID) | ORAL | 0 refills | Status: AC

## 2021-11-12 NOTE — ED Notes (Signed)
Patient states he feels "beat". 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 Has not had much to eat or drink. Pt is hypertensive with no hx of the same. Does not take any meds for HTN. Denies pain, however earlier he had some ABD pain.

## 2021-11-12 NOTE — ED Triage Notes (Signed)
Pt arrives pov, steady gait, c/o abdominal pain, HA since yesterday. Denies dysuria, denies diarrhea, endorses more freq BM

## 2021-11-12 NOTE — Discharge Instructions (Signed)
Your work-up today was overall reassuring.  Given you are having a headache, body aches, fatigue, and generalized abdominal discomfort you likely have a viral infection.  We did discuss obtaining further work-up with a CT scan and COVID and flu however you were in agreement with symptomatic management and returning for any worsening symptoms.  Your blood pressure was also elevated.  I recommend you keep a blood pressure diary and establish a primary care provider.  I have given you information for [facility] for you to establish care with.  Keep a blood pressure diary in the meantime.  For any concerning symptoms return to the emergency time.  I have sent Bentyl, Zofran ODT into the pharmacy for you for symptom management.  Please increase your hydration and cut down on soft drinks and red bowls.

## 2021-11-12 NOTE — ED Provider Notes (Signed)
[facility] EMERGENCY DEPARTMENT Provider Note   CSN: [id number] Arrival date & time: 11/12/21  1708     History  Chief Complaint  Patient presents with   Abdominal Pain    Barry Lester is a 31 y.o. male.  31 year old male presents today for 2-day duration of headache, body aches, fatigue, and generalized abdominal discomfort.  Denies fever, chills, dysuria, hematuria.  Has history of appendectomy.  Patient states he works 12-hour shifts and has been working about 6 shifts in a row and does not drink any significant amounts of water.  He mostly drinks soft drinks and red bulls.  He endorses loose stools over the past couple days.  However no vomiting.  No blood in the stools.  The history is provided by the patient. No language interpreter was used.       Home Medications Prior to Admission medications   Medication Sig Start Date End Date Taking? Authorizing Provider  dicyclomine (BENTYL) 20 MG tablet Take 1 tablet (20 mg total) by mouth 2 (two) times daily. 11/12/21  Yes Kassey Laforest, PA-C  ondansetron (ZOFRAN-ODT) 4 MG disintegrating tablet Take 1 tablet (4 mg total) by mouth every 8 (eight) hours as needed. 11/12/21  Yes Marita Kansas, PA-C      Allergies    Patient has no known allergies.    Review of Systems   Review of Systems  Constitutional:  Positive for fatigue. Negative for chills and fever.  HENT:  Positive for congestion and nosebleeds. Negative for sore throat.   Respiratory:  Negative for cough.   Gastrointestinal:  Positive for abdominal pain and diarrhea. Negative for nausea and vomiting.  Genitourinary:  Negative for dysuria and flank pain.  Neurological:  Positive for headaches. Negative for light-headedness.  All other systems reviewed and are negative.   Physical Exam Updated Vital Signs BP (!) 141/109   Pulse 62   Temp 98.4 F (36.9 C) (Oral)   Resp 18   Ht 5\' 6"  (1.676 m)   Wt 63.5 kg   SpO2 100%   BMI 22.60 kg/m  Physical  Exam Vitals and nursing note reviewed.  Constitutional:      General: He is not in acute distress.    Appearance: Normal appearance. He is not ill-appearing.  HENT:     Head: Normocephalic and atraumatic.     Nose: Nose normal.  Eyes:     General: No scleral icterus.    Extraocular Movements: Extraocular movements intact.     Conjunctiva/sclera: Conjunctivae normal.  Cardiovascular:     Rate and Rhythm: Normal rate and regular rhythm.     Pulses: Normal pulses.     Heart sounds: Normal heart sounds.  Pulmonary:     Effort: Pulmonary effort is normal. No respiratory distress.     Breath sounds: Normal breath sounds. No wheezing or rales.  Abdominal:     General: There is no distension.     Palpations: Abdomen is soft.     Tenderness: There is no abdominal tenderness. There is no right CVA tenderness, left CVA tenderness, guarding or rebound.  Musculoskeletal:        General: No deformity. Normal range of motion.     Cervical back: Normal range of motion.  Skin:    General: Skin is warm and dry.     Findings: No rash.  Neurological:     General: No focal deficit present.     Mental Status: He is alert. Mental status is at baseline.  ED Results / Procedures / Treatments   Labs (all labs ordered are listed, but only abnormal results are displayed) Labs Reviewed  COMPREHENSIVE METABOLIC PANEL - Abnormal; Notable for the following components:      Result Value   Glucose, Bld 110 (*)    All other components within normal limits  CBC - Abnormal; Notable for the following components:   WBC 13.3 (*)    All other components within normal limits  URINALYSIS, ROUTINE W REFLEX MICROSCOPIC - Abnormal; Notable for the following components:   APPearance HAZY (*)    pH 8.5 (*)    All other components within normal limits  LIPASE, BLOOD    EKG None  Radiology No results found.  Procedures Procedures    Medications Ordered in ED Medications - No data to display  ED  Course/ Medical Decision Making/ A&P                           Medical Decision Making Amount and/or Complexity of Data Reviewed Labs: ordered.  Risk Prescription drug management.   Medical Decision Making / ED Course   This patient presents to the ED for concern of abdominal pain, this involves an extensive number of treatment options, and is a complaint that carries with it a high risk of complications and morbidity.  The differential diagnosis includes gastroenteritis, cholecystitis, pancreatitis, colitis, viral URI  MDM: 31 year old male presents today for evaluation of above-mentioned complaints.  Overall he is well-appearing.  Tolerating p.o. intake without difficulty.  Abdomen is benign.  Without nausea or vomiting.  Loose stools no significant diarrhea.  Without flank tenderness.  History of appendectomy.  Also having fatigue, body aches, as well as sinus congestion.  Likely viral syndrome.  Work-up overall reassuring with the exception of mild leukocytosis.  Likely reactive.  Without anemia.  UA without evidence of UTI.  Lipase within normal limits.  CMP unremarkable with exception of glucose of 110.  We had a shared decision making conversation regarding additional work-up with CT imaging versus symptomatic management, increasing hydration and return precautions for any worsening symptoms.  This was also discussed with patient's mom over the phone.  His blood pressure was also elevated.  He is without chest pain, shortness of breath, visual change, balance issues.  Discussed keeping a blood pressure diary and establishing a primary care provider.  Patient voices understanding and is in agreement with plan.  Lab Tests: -I ordered, reviewed, and interpreted labs.   The pertinent results include:   Labs Reviewed  COMPREHENSIVE METABOLIC PANEL - Abnormal; Notable for the following components:      Result Value   Glucose, Bld 110 (*)    All other components within normal limits  CBC -  Abnormal; Notable for the following components:   WBC 13.3 (*)    All other components within normal limits  URINALYSIS, ROUTINE W REFLEX MICROSCOPIC - Abnormal; Notable for the following components:   APPearance HAZY (*)    pH 8.5 (*)    All other components within normal limits  LIPASE, BLOOD      EKG  EKG Interpretation  Date/Time:    Ventricular Rate:    PR Interval:    QRS Duration:   QT Interval:    QTC Calculation:   R Axis:     Text Interpretation:          Medicines ordered and prescription drug management: Meds ordered this encounter  Medications  ondansetron (ZOFRAN-ODT) 4 MG disintegrating tablet    Sig: Take 1 tablet (4 mg total) by mouth every 8 (eight) hours as needed.    Dispense:  20 tablet    Refill:  0    Order Specific Question:   Supervising Provider    Answer:   Hyacinth Meeker, BRIAN [[id number]]   dicyclomine (BENTYL) 20 MG tablet    Sig: Take 1 tablet (20 mg total) by mouth 2 (two) times daily.    Dispense:  20 tablet    Refill:  0    Order Specific Question:   Supervising Provider    Answer:   Hyacinth Meeker, BRIAN [[id number]]    -I have reviewed the patients home medicines and have made adjustments as needed  Reevaluation: After the interventions noted above, I reevaluated the patient and found that they have :stayed the same Patient remained comfortable and tolerated p.o. intake during his ED stay.  Co morbidities that complicate the patient evaluation  Past Medical History:  Diagnosis Date   Hypertension    Prediabetes       Dispostion: Patient is appropriate for discharge.  Discharged in stable condition.  Return precautions discussed.  Patient voices understanding and is in agreement with plan.  Final Clinical Impression(s) / ED Diagnoses Final diagnoses:  Viral URI  Generalized abdominal pain    Rx / DC Orders ED Discharge Orders          Ordered    ondansetron (ZOFRAN-ODT) 4 MG disintegrating tablet  Every 8 hours PRN        11/12/21 2106     dicyclomine (BENTYL) 20 MG tablet  2 times daily        11/12/21 2106              Marita Kansas, PA-C 11/12/21 2114    Glynn Octave, MD 11/12/21 2220

## 2021-11-22 ENCOUNTER — Encounter (HOSPITAL_BASED_OUTPATIENT_CLINIC_OR_DEPARTMENT_OTHER): Payer: Self-pay | Admitting: Urology

## 2021-11-22 ENCOUNTER — Emergency Department (HOSPITAL_BASED_OUTPATIENT_CLINIC_OR_DEPARTMENT_OTHER): Payer: Self-pay

## 2021-11-22 DIAGNOSIS — R0789 Other chest pain: Secondary | ICD-10-CM | POA: Insufficient documentation

## 2021-11-22 DIAGNOSIS — F17200 Nicotine dependence, unspecified, uncomplicated: Secondary | ICD-10-CM | POA: Insufficient documentation

## 2021-11-22 DIAGNOSIS — Z20822 Contact with and (suspected) exposure to covid-19: Secondary | ICD-10-CM | POA: Insufficient documentation

## 2021-11-22 LAB — BASIC METABOLIC PANEL
Anion gap: 6 (ref 5–15)
BUN: 12 mg/dL (ref 6–20)
CO2: 23 mmol/L (ref 22–32)
Calcium: 8.3 mg/dL — ABNORMAL LOW (ref 8.9–10.3)
Chloride: 108 mmol/L (ref 98–111)
Creatinine, Ser: 1.06 mg/dL (ref 0.61–1.24)
GFR, Estimated: >60 mL/min (ref >60)
Glucose, Bld: 105 mg/dL — ABNORMAL HIGH (ref 70–99)
Potassium: 4 mmol/L (ref 3.5–5.1)
Sodium: 137 mmol/L (ref 135–145)

## 2021-11-22 LAB — TROPONIN I (HIGH SENSITIVITY): Troponin I (High Sensitivity): 4 ng/L (ref <18)

## 2021-11-22 LAB — CBC
HCT: 41.6 % (ref 39.0–52.0)
Hemoglobin: 14.5 g/dL (ref 13.0–17.0)
MCH: 30.1 pg (ref 26.0–34.0)
MCHC: 34.9 g/dL (ref 30.0–36.0)
MCV: 86.5 fL (ref 80.0–100.0)
Platelets: 270 10*3/uL (ref 150–400)
RBC: 4.81 MIL/uL (ref 4.22–5.81)
RDW: 13.1 % (ref 11.5–15.5)
WBC: 11.7 10*3/uL — ABNORMAL HIGH (ref 4.0–10.5)
nRBC: 0 % (ref 0.0–0.2)

## 2021-11-22 LAB — RESP PANEL BY RT-PCR (FLU A&B, COVID) ARPGX2
Influenza A by PCR: NEGATIVE
Influenza B by PCR: NEGATIVE
SARS Coronavirus 2 by RT PCR: NEGATIVE

## 2021-11-22 NOTE — ED Triage Notes (Signed)
Pt reports left sided chest pain that started today, states pain is intermittent, states "it felt like acid reflux" Denies SOB, denies N/V, states mild headache  Denies fever

## 2021-11-22 NOTE — ED Notes (Signed)
States exposure to covid at work

## 2021-11-23 ENCOUNTER — Emergency Department (HOSPITAL_BASED_OUTPATIENT_CLINIC_OR_DEPARTMENT_OTHER)
Admission: EM | Admit: 2021-11-23 | Discharge: 2021-11-23 | Disposition: A | Discharge status: Home / Self Care | Payer: Self-pay | Attending: Emergency Medicine | Admitting: Emergency Medicine

## 2021-11-23 DIAGNOSIS — R0789 Other chest pain: Secondary | ICD-10-CM

## 2021-11-23 NOTE — ED Provider Notes (Signed)
[facility] EMERGENCY DEPARTMENT Provider Note   CSN: [id number] Arrival date & time: 11/22/21  2237     History  Chief Complaint  Patient presents with   Chest Pain    Barry Lester is a 31 y.o. male.  The history is provided by the patient and medical records.  Chest Pain Barry Lester is a 31 y.o. male who presents to the Emergency Department complaining of chest pain.  He presents to the ED for evaluation of left sided chest pain that started Monday morning.  He felt like it may have been reflux/indigestion.  Pain is described as sharp and burning - pain resolved at time of ED evaluation.    No fever, sob, cough, AP, N/V, leg swelling/pain.    No medical problems.  No meds.    Occasional tobacco.  No alcohol.  No street drugs.    Father with hx/o CAD - died of MI at 5.  DM on mother's side.  No family hx/o DVT/PE.       Home Medications Prior to Admission medications   Medication Sig Start Date End Date Taking? Authorizing Provider  dicyclomine (BENTYL) 20 MG tablet Take 1 tablet (20 mg total) by mouth 2 (two) times daily. 11/12/21   Karie Mainland, Amjad, PA-C  ondansetron (ZOFRAN-ODT) 4 MG disintegrating tablet Take 1 tablet (4 mg total) by mouth every 8 (eight) hours as needed. 11/12/21   Marita Kansas, PA-C      Allergies    Patient has no known allergies.    Review of Systems   Review of Systems  Cardiovascular:  Positive for chest pain.  All other systems reviewed and are negative.   Physical Exam Updated Vital Signs BP 127/88   Pulse (!) 58   Temp 98.5 F (36.9 C) (Oral)   Resp 16   Ht 5\' 6"  (1.676 m)   Wt 63.5 kg   SpO2 100%   BMI 22.60 kg/m  Physical Exam Vitals and nursing note reviewed.  Constitutional:      Appearance: He is well-developed.  HENT:     Head: Normocephalic and atraumatic.  Cardiovascular:     Rate and Rhythm: Normal rate and regular rhythm.     Heart sounds: No murmur heard. Pulmonary:     Effort: Pulmonary effort is  normal. No respiratory distress.     Breath sounds: Normal breath sounds.  Abdominal:     Palpations: Abdomen is soft.     Tenderness: There is no abdominal tenderness. There is no guarding or rebound.  Musculoskeletal:        General: No swelling or tenderness.  Skin:    General: Skin is warm and dry.  Neurological:     Mental Status: He is alert and oriented to person, place, and time.  Psychiatric:        Behavior: Behavior normal.     ED Results / Procedures / Treatments   Labs (all labs ordered are listed, but only abnormal results are displayed) Labs Reviewed  BASIC METABOLIC PANEL - Abnormal; Notable for the following components:      Result Value   Glucose, Bld 105 (*)    Calcium 8.3 (*)    All other components within normal limits  CBC - Abnormal; Notable for the following components:   WBC 11.7 (*)    All other components within normal limits  RESP PANEL BY RT-PCR (FLU A&B, COVID) ARPGX2  TROPONIN I (HIGH SENSITIVITY)  TROPONIN I (HIGH SENSITIVITY)    EKG  EKG Interpretation  Date/Time:  Monday November 22 2021 22:44:33 EDT Ventricular Rate:  60 PR Interval:  124 QRS Duration: 86 QT Interval:  380 QTC Calculation: 380 R Axis:   104 Text Interpretation: Normal sinus rhythm Rightward axis ST elevation, consider early repolarization, pericarditis, or injury Abnormal ECG When compared with ECG of 02-May-2021 01:58, No significant change since last tracing Confirmed by Aletta Edouard [phone]) on 11/22/2021 10:47:53 PM  Radiology DG Chest 2 View  Result Date: 11/22/2021 CLINICAL DATA:  Chest pain EXAM: CHEST - 2 VIEW COMPARISON:  Chest x-ray 05/02/2021 FINDINGS: The heart size and mediastinal contours are within normal limits. Both lungs are clear. The visualized skeletal structures are unremarkable. IMPRESSION: No active cardiopulmonary disease. Electronically Signed   By: Ronney Asters M.D.   On: 11/22/2021 23:07    Procedures Procedures    Medications  Ordered in ED Medications - No data to display  ED Course/ Medical Decision Making/ A&P                           Medical Decision Making Amount and/or Complexity of Data Reviewed Labs: ordered. Radiology: ordered.   Patient here for evaluation of left-sided burning chest pain, resolved at time of ED presentation.  He has no significant medical problems.  He does have a history of coronary artery disease in his father.  EKG is unchanged when compared to priors.  Troponin is negative.  Current clinical picture is not consistent with ACS, dissection.  Doubt PE he is PERC negative.  No evidence of pneumonia.  Chest x-ray is negative for acute abnormality-images personally reviewed and interpreted.  Discussed with patient home care for chest pain, suspect there may be a reflux component.  Discussed OTC medications for reflux.  Discussed outpatient follow-up and return precautions.        Final Clinical Impression(s) / ED Diagnoses Final diagnoses:  Atypical chest pain    Rx / DC Orders ED Discharge Orders     None         Quintella Reichert, MD 11/23/21 [phone]

## 2021-11-23 NOTE — Discharge Instructions (Addendum)
You can take pepcid, available over the counter according to label instructions if you have indigestion.

## 2022-02-23 ENCOUNTER — Other Ambulatory Visit: Payer: Self-pay

## 2022-02-23 ENCOUNTER — Emergency Department (HOSPITAL_BASED_OUTPATIENT_CLINIC_OR_DEPARTMENT_OTHER)
Admission: EM | Admit: 2022-02-23 | Discharge: 2022-02-23 | Disposition: A | Discharge status: Home / Self Care | Payer: Self-pay | Attending: Emergency Medicine | Admitting: Emergency Medicine

## 2022-02-23 ENCOUNTER — Encounter (HOSPITAL_BASED_OUTPATIENT_CLINIC_OR_DEPARTMENT_OTHER): Payer: Self-pay | Admitting: Urology

## 2022-02-23 DIAGNOSIS — I10 Essential (primary) hypertension: Secondary | ICD-10-CM | POA: Insufficient documentation

## 2022-02-23 DIAGNOSIS — U071 COVID-19: Secondary | ICD-10-CM | POA: Insufficient documentation

## 2022-02-23 LAB — RESP PANEL BY RT-PCR (RSV, FLU A&B, COVID)  RVPGX2
Influenza A by PCR: NEGATIVE
Influenza B by PCR: NEGATIVE
Resp Syncytial Virus by PCR: NEGATIVE
SARS Coronavirus 2 by RT PCR: POSITIVE — AB

## 2022-02-23 MED ORDER — PAXLOVID (300/100) 20 X 150 MG & 10 X 100MG PO TBPK: 3.0000 | ORAL_TABLET | Freq: Two times a day (BID) | ORAL | 0 refills | Status: AC

## 2022-02-23 MED ORDER — ONDANSETRON 4 MG PO TBDP: 4.0000 mg | ORAL_TABLET | Freq: Three times a day (TID) | ORAL | 0 refills | Status: DC | PRN

## 2022-02-23 NOTE — Discharge Instructions (Signed)
Please take the antiviral medications as prescribed.  Return to the emergency department any new or suddenly worsening symptoms.

## 2022-02-23 NOTE — ED Notes (Signed)
Rx x 2 given  Written and verbal inst to pt  Verbalized an understanding  To home  

## 2022-02-23 NOTE — ED Provider Notes (Signed)
Emergency Department Provider Note   I have reviewed the triage vital signs and the nursing notes.   HISTORY  Chief Complaint Flu like symptoms    HPI Barry Lester is a 32 y.o. male with past medical history of hypertension presents to the emergency department for evaluation of bodyaches with fever, fatigue.  Symptoms began this morning.  He has some congestion along with cough and sore throat.  No chest pain or shortness of breath.  No sick contacts.   Past Medical History:  Diagnosis Date   Hypertension    Prediabetes     Review of Systems  Constitutional: Positive fever/chills and body aches.  ENT: Positive congestion and sore throat.  Cardiovascular: Denies chest pain. Respiratory: Denies shortness of breath. Gastrointestinal: No abdominal pain.  Genitourinary: Negative for dysuria. Musculoskeletal: Negative for back pain. Skin: Negative for rash.  ____________________________________________   PHYSICAL EXAM:  VITAL SIGNS: ED Triage Vitals  Enc Vitals Group     BP 02/23/22 2112 (!) 132/90     Pulse Rate 02/23/22 2112 69     Resp 02/23/22 2112 18     Temp 02/23/22 2112 98.2 F (36.8 C)     Temp Source 02/23/22 2112 Oral     SpO2 02/23/22 2112 100 %     Weight 02/23/22 2112 140 lb (63.5 kg)     Height 02/23/22 2112 5\' 6"  (1.676 m)   Constitutional: Alert and oriented. Well appearing and in no acute distress. Eyes: Conjunctivae are normal.  Head: Atraumatic. Nose: Positive congestion/rhinnorhea. Mouth/Throat: Mucous membranes are moist.  Oropharynx non-erythematous. Neck: No stridor.   Cardiovascular: Normal rate, regular rhythm. Good peripheral circulation. Grossly normal heart sounds.   Respiratory: Normal respiratory effort.  No retractions. Lungs CTAB. Gastrointestinal: Soft and nontender. No distention.  Musculoskeletal: No gross deformities of extremities. Neurologic:  Normal speech and language.  Skin:  Skin is warm, dry and intact. No rash  noted.   ____________________________________________   LABS (all labs ordered are listed, but only abnormal results are displayed)  Labs Reviewed  RESP PANEL BY RT-PCR (RSV, FLU A&B, COVID)  RVPGX2 - Abnormal; Notable for the following components:      Result Value   SARS Coronavirus 2 by RT PCR POSITIVE (*)    All other components within normal limits   ____________________________________________   PROCEDURES  Procedure(s) performed:   Procedures  None  ____________________________________________   INITIAL IMPRESSION / ASSESSMENT AND PLAN / ED COURSE  Pertinent labs & imaging results that were available during my care of the patient were reviewed by me and considered in my medical decision making (see chart for details).   This patient is Presenting for Evaluation of flu-like symptoms, which does require a range of treatment options, and is a complaint that involves a moderate risk of morbidity and mortality.  The Differential Diagnoses include COVID, Flu, RSV, CAP, etc.  Clinical Laboratory Tests Ordered, included COVID positive.   Radiologic Tests: Considered CXR but lungs are CTABL. No hypoxemia.   Medical Decision Making: Summary:  Patient presents to the emergency department with flulike symptoms.  Lungs are clear with normal oxygen saturation.  Abdomen diffusely soft and nontender.  Do not plan on emergent imaging at this time.  He is testing positive for COVID which correlates well with his symptoms.  Discussed quarantine and antiviral medications.   Patient's presentation is most consistent with acute presentation with potential threat to life or bodily function.   Disposition: discharge  ____________________________________________  FINAL  CLINICAL IMPRESSION(S) / ED DIAGNOSES  Final diagnoses:  COVID-19     NEW OUTPATIENT MEDICATIONS STARTED DURING THIS VISIT:  Discharge Medication List as of 02/23/2022 10:21 PM     START taking these  medications   Details  nirmatrelvir & ritonavir (PAXLOVID, 300/100,) 20 x 150 MG & 10 x 100MG  TBPK Take 3 tablets by mouth 2 (two) times daily for 5 days., Starting Wed 02/23/2022, Until Mon 02/28/2022, Normal        Note:  This document was prepared using Dragon voice recognition software and may include unintentional dictation errors.  Nanda Quinton, MD, [facility]    Lochlyn Zullo, [facility], MD 02/28/22 [phone]

## 2022-02-23 NOTE — ED Triage Notes (Signed)
Pt states body aches, loss of appetite, fever, fatigue that started this morning

## 2022-04-13 ENCOUNTER — Encounter (HOSPITAL_BASED_OUTPATIENT_CLINIC_OR_DEPARTMENT_OTHER): Payer: Self-pay | Admitting: Urology

## 2022-04-13 ENCOUNTER — Other Ambulatory Visit: Payer: Self-pay

## 2022-04-13 ENCOUNTER — Emergency Department (HOSPITAL_BASED_OUTPATIENT_CLINIC_OR_DEPARTMENT_OTHER)
Admission: EM | Admit: 2022-04-13 | Discharge: 2022-04-13 | Disposition: A | Discharge status: Home / Self Care | Payer: 59 | Attending: Emergency Medicine | Admitting: Emergency Medicine

## 2022-04-13 DIAGNOSIS — Z1152 Encounter for screening for COVID-19: Secondary | ICD-10-CM | POA: Insufficient documentation

## 2022-04-13 DIAGNOSIS — Z8616 Personal history of COVID-19: Secondary | ICD-10-CM | POA: Insufficient documentation

## 2022-04-13 DIAGNOSIS — B349 Viral infection, unspecified: Secondary | ICD-10-CM | POA: Diagnosis not present

## 2022-04-13 DIAGNOSIS — R209 Unspecified disturbances of skin sensation: Secondary | ICD-10-CM | POA: Diagnosis not present

## 2022-04-13 DIAGNOSIS — M791 Myalgia, unspecified site: Secondary | ICD-10-CM | POA: Diagnosis present

## 2022-04-13 LAB — RESP PANEL BY RT-PCR (RSV, FLU A&B, COVID)  RVPGX2
Influenza A by PCR: NEGATIVE
Influenza B by PCR: NEGATIVE
Resp Syncytial Virus by PCR: NEGATIVE
SARS Coronavirus 2 by RT PCR: NEGATIVE

## 2022-04-13 NOTE — ED Triage Notes (Signed)
Pt states left foot numbness and bilateral hand numbness that started this am at 0700  States felt the same way he had COVID last time and wants a covid test  States numbness resolved, states body aches and fatigue that started this afternoon

## 2022-04-13 NOTE — ED Provider Notes (Signed)
[facility] Provider Note   CSN: [id number] Arrival date & time: 04/13/22  1941     History  Chief Complaint  Patient presents with   Covid Symptoms     Barry Lester is a 32 y.o. male.  Patient with history of hypertension and prediabetes presents today requesting a COVID test.  He states that when he woke up this morning around 7 AM he noticed numbness and tingling in his bilateral fingers and toes.  He states that he proceeded to go to work, however his symptoms persisted for several hours and he was subsequently sent home.  He states that he went home and rested for a few hours and woke up and his symptoms were gone.  He does endorse some generalized fatigue and bodyaches that started this afternoon as well.  He states that the symptoms were similar to when he had COVID previously and he requests testing for same.  He denies any headaches, vision changes, neck pain, fevers, chills, chest pain, shortness of breath, nausea, vomiting, diarrhea, abdominal pain, hematuria, or dysuria.     The history is provided by the patient. No language interpreter was used.       Home Medications Prior to Admission medications   Medication Sig Start Date End Date Taking? Authorizing Provider  dicyclomine (BENTYL) 20 MG tablet Take 1 tablet (20 mg total) by mouth 2 (two) times daily. 11/12/21   Deatra Canter, Amjad, PA-C  ondansetron (ZOFRAN-ODT) 4 MG disintegrating tablet Take 1 tablet (4 mg total) by mouth every 8 (eight) hours as needed for nausea or vomiting. 02/23/22   Long, [facility], MD      Allergies    Patient has no known allergies.    Review of Systems   Review of Systems  All other systems reviewed and are negative.   Physical Exam Updated Vital Signs BP 138/79 (BP Location: Left Arm)   Pulse 77   Temp 98.5 F (36.9 C) (Oral)   Resp 18   Ht 5\' 6"  (1.676 m)   Wt 63.5 kg   SpO2 97%   BMI 22.60 kg/m  Physical Exam Vitals and nursing  note reviewed.  Constitutional:      General: He is not in acute distress.    Appearance: Normal appearance. He is normal weight. He is not ill-appearing, toxic-appearing or diaphoretic.  HENT:     Head: Normocephalic and atraumatic.  Eyes:     Extraocular Movements: Extraocular movements intact.     Pupils: Pupils are equal, round, and reactive to light.  Cardiovascular:     Rate and Rhythm: Normal rate and regular rhythm.     Heart sounds: Normal heart sounds.  Pulmonary:     Effort: Pulmonary effort is normal. No respiratory distress.     Breath sounds: Normal breath sounds.  Abdominal:     General: Abdomen is flat.     Palpations: Abdomen is soft.     Tenderness: There is no abdominal tenderness.  Musculoskeletal:        General: Normal range of motion.     Cervical back: Normal range of motion and neck supple.  Skin:    General: Skin is warm and dry.  Neurological:     General: No focal deficit present.     Mental Status: He is alert and oriented to person, place, and time.     GCS: GCS eye subscore is 4. GCS verbal subscore is 5. GCS motor subscore is 6.  Cranial Nerves: No cranial nerve deficit.     Sensory: No sensory deficit.     Motor: No weakness.     Coordination: Coordination normal.     Gait: Gait normal.  Psychiatric:        Mood and Affect: Mood normal.        Behavior: Behavior normal.     ED Results / Procedures / Treatments   Labs (all labs ordered are listed, but only abnormal results are displayed) Labs Reviewed  RESP PANEL BY RT-PCR (RSV, FLU A&B, COVID)  RVPGX2    EKG None  Radiology No results found.  Procedures Procedures    Medications Ordered in ED Medications - No data to display  ED Course/ Medical Decision Making/ A&P                             Medical Decision Making  Patient presents today requesting a COVID test.  He is afebrile, nontoxic-appearing, and in no acute distress with reassuring vital signs.  He is also  alert and oriented and neurologically intact without focal deficits.  He endorses bilateral numbness and tingling in his fingers and toes that began this morning and persisted for a few hours.  Also endorses some residual generalized bodyaches and fatigue.  He is negative for COVID, flu, and RSV.  I did offer additional workup with laboratory evaluation, however patient states he only wanted a COVID test.  Suspect patient has a viral illness, discussed with patient who is understanding and in agreement.  Patient is low risk age with no comorbid conditions and is overall well-appearing.  Very low suspicion for TIA.  I did offer to just check the patient's blood sugar given his history of prediabetes but he does not want this.  He is not exhibiting any signs of DKA.  Evaluation and diagnostic testing in the emergency department does not suggest an emergent condition requiring admission or immediate intervention beyond what has been performed at this time.  Plan for discharge with close PCP follow-up.  Patient is understanding and amenable with plan, educated on red flag symptoms that would prompt immediate return.  Patient discharged in stable condition.   Final Clinical Impression(s) / ED Diagnoses Final diagnoses:  Viral illness    Rx / DC Orders ED Discharge Orders     None     An After Visit Summary was printed and given to the patient.     Nestor Lewandowsky 04/13/22 2230    Hayden Rasmussen, MD 04/14/22 1016

## 2022-04-13 NOTE — Discharge Instructions (Signed)
As we discussed, your workup in the ER today was reassuring for acute findings.  You were negative for COVID, flu, and RSV.  I suspect you have a viral illness causing your symptoms.  I do recommend close primary care follow-up and symptomatic management with Tylenol/ibuprofen.  Return if development of any new or worsening symptoms.

## 2022-07-11 ENCOUNTER — Emergency Department (HOSPITAL_BASED_OUTPATIENT_CLINIC_OR_DEPARTMENT_OTHER)
Admission: EM | Admit: 2022-07-11 | Discharge: 2022-07-11 | Disposition: A | Discharge status: Home / Self Care | Payer: Self-pay | Attending: Emergency Medicine | Admitting: Emergency Medicine

## 2022-07-11 ENCOUNTER — Encounter (HOSPITAL_BASED_OUTPATIENT_CLINIC_OR_DEPARTMENT_OTHER): Payer: Self-pay | Admitting: Emergency Medicine

## 2022-07-11 ENCOUNTER — Emergency Department (HOSPITAL_BASED_OUTPATIENT_CLINIC_OR_DEPARTMENT_OTHER): Payer: Self-pay

## 2022-07-11 ENCOUNTER — Other Ambulatory Visit: Payer: Self-pay

## 2022-07-11 DIAGNOSIS — I10 Essential (primary) hypertension: Secondary | ICD-10-CM | POA: Insufficient documentation

## 2022-07-11 DIAGNOSIS — F17290 Nicotine dependence, other tobacco product, uncomplicated: Secondary | ICD-10-CM | POA: Insufficient documentation

## 2022-07-11 DIAGNOSIS — K2901 Acute gastritis with bleeding: Secondary | ICD-10-CM

## 2022-07-11 LAB — BASIC METABOLIC PANEL
Anion gap: 7 (ref 5–15)
BUN: 11 mg/dL (ref 6–20)
CO2: 26 mmol/L (ref 22–32)
Calcium: 9.2 mg/dL (ref 8.9–10.3)
Chloride: 106 mmol/L (ref 98–111)
Creatinine, Ser: 1.17 mg/dL (ref 0.61–1.24)
GFR, Estimated: >60 mL/min (ref >60)
Glucose, Bld: 119 mg/dL — ABNORMAL HIGH (ref 70–99)
Potassium: 3.5 mmol/L (ref 3.5–5.1)
Sodium: 139 mmol/L (ref 135–145)

## 2022-07-11 LAB — TROPONIN I (HIGH SENSITIVITY)
Troponin I (High Sensitivity): 20 ng/L — ABNORMAL HIGH (ref <18)
Troponin I (High Sensitivity): 13 ng/L (ref <18)

## 2022-07-11 LAB — CBC
HCT: 43.5 % (ref 39.0–52.0)
Hemoglobin: 14.7 g/dL (ref 13.0–17.0)
MCH: 29.8 pg (ref 26.0–34.0)
MCHC: 33.8 g/dL (ref 30.0–36.0)
MCV: 88.1 fL (ref 80.0–100.0)
Platelets: 267 10*3/uL (ref 150–400)
RBC: 4.94 MIL/uL (ref 4.22–5.81)
RDW: 13.1 % (ref 11.5–15.5)
WBC: 10.3 10*3/uL (ref 4.0–10.5)
nRBC: 0 % (ref 0.0–0.2)

## 2022-07-11 MED ORDER — DIPHENHYDRAMINE HCL 50 MG PO TABS: 50.0000 mg | ORAL_TABLET | Freq: Every evening | ORAL | 0 refills | Status: AC | PRN

## 2022-07-11 MED ORDER — MAALOX MAX 400-400-40 MG/5ML PO SUSP: 15.0000 mL | Freq: Four times a day (QID) | ORAL | 0 refills | Status: AC | PRN

## 2022-07-11 MED ORDER — ALUM & MAG HYDROXIDE-SIMETH 200-200-20 MG/5ML PO SUSP
30.0000 mL | Freq: Once | ORAL | Status: AC
  Administered 2022-07-11: 30 mL via ORAL
  Filled 2022-07-11: qty 30

## 2022-07-11 MED ORDER — LIDOCAINE VISCOUS HCL 2 % MT SOLN
15.0000 mL | Freq: Once | OROMUCOSAL | Status: AC
  Administered 2022-07-11: 15 mL via ORAL
  Filled 2022-07-11: qty 15

## 2022-07-11 MED ORDER — FAMOTIDINE 40 MG PO TABS: 40.0000 mg | ORAL_TABLET | Freq: Every day | ORAL | 0 refills | Status: AC

## 2022-07-11 NOTE — ED Provider Notes (Signed)
[place] [facility] Provider Note  CSN: [id number] Arrival date & time: 07/11/22 1006  Chief Complaint(s) Chest Pain  HPI Barry Lester is a 32 y.o. male with history of hypertension presenting to the emergency department with chest pain.  Patient reports his pain is in his lower chest/epigastric region.  He reports it is dull.  He reports that he has had mild nausea, 1 episode of vomiting on Saturday with some blood in his vomit.  No further episodes.  No black or bloody stools.  No shortness of breath, cough.  No syncope.  He reports that he takes Advil every day, he takes Advil PM to help him sleep.  Pain does not radiate.   Past Medical History Past Medical History:  Diagnosis Date   Hypertension    Prediabetes    There are no problems to display for this patient.  Home Medication(s) Prior to Admission medications   Medication Sig Start Date End Date Taking? Authorizing Provider  alum & mag hydroxide-simeth (MAALOX MAX) 400-400-40 MG/5ML suspension Take 15 mLs by mouth every 6 (six) hours as needed for indigestion. 07/11/22  Yes Lonell Grandchild, MD  diphenhydrAMINE (BENADRYL) 50 MG tablet Take 1 tablet (50 mg total) by mouth at bedtime as needed for sleep. 07/11/22  Yes Lonell Grandchild, MD  famotidine (PEPCID) 40 MG tablet Take 1 tablet (40 mg total) by mouth daily. 07/11/22  Yes Lonell Grandchild, MD  dicyclomine (BENTYL) 20 MG tablet Take 1 tablet (20 mg total) by mouth 2 (two) times daily. 11/12/21   Karie Mainland, Amjad, PA-C  ondansetron (ZOFRAN-ODT) 4 MG disintegrating tablet Take 1 tablet (4 mg total) by mouth every 8 (eight) hours as needed for nausea or vomiting. 02/23/22   Long, Arlyss Repress, MD                                                                                                                                    Past Surgical History Past Surgical History:  Procedure Laterality Date   APPENDECTOMY     Family History History  reviewed. No pertinent family history.  Social History Social History   Tobacco Use   Smoking status: Some Days    Types: Cigarettes, Cigars   Smokeless tobacco: Never  Vaping Use   Vaping Use: Never used  Substance Use Topics   Alcohol use: No   Drug use: Yes    Types: Marijuana    Comment: occ   Allergies Patient has no known allergies.  Review of Systems Review of Systems  All other systems reviewed and are negative.   Physical Exam Vital Signs  I have reviewed the triage vital signs BP (!) 124/97   Pulse (!) 54   Temp 98.7 F (37.1 C)   Resp (!) 22   Ht 5\' 6"  (1.676 m)   Wt 63.5 kg   SpO2 100%   BMI 22.60  kg/m  Physical Exam Vitals and nursing note reviewed.  Constitutional:      General: He is not in acute distress.    Appearance: Normal appearance.  HENT:     Mouth/Throat:     Mouth: Mucous membranes are moist.  Eyes:     Conjunctiva/sclera: Conjunctivae normal.  Cardiovascular:     Rate and Rhythm: Normal rate and regular rhythm.  Pulmonary:     Effort: Pulmonary effort is normal. No respiratory distress.     Breath sounds: Normal breath sounds.  Abdominal:     General: Abdomen is flat.     Palpations: Abdomen is soft.     Tenderness: There is no abdominal tenderness.  Musculoskeletal:     Right lower leg: No edema.     Left lower leg: No edema.  Skin:    General: Skin is warm and dry.     Capillary Refill: Capillary refill takes less than 2 seconds.  Neurological:     Mental Status: He is alert and oriented to person, place, and time. Mental status is at baseline.  Psychiatric:        Mood and Affect: Mood normal.        Behavior: Behavior normal.     ED Results and Treatments Labs (all labs ordered are listed, but only abnormal results are displayed) Labs Reviewed  BASIC METABOLIC PANEL - Abnormal; Notable for the following components:      Result Value   Glucose, Bld 119 (*)    All other components within normal limits  TROPONIN  I (HIGH SENSITIVITY) - Abnormal; Notable for the following components:   Troponin I (High Sensitivity) 20 (*)    All other components within normal limits  CBC  TROPONIN I (HIGH SENSITIVITY)                                                                                                                          Radiology DG Chest 2 View  Result Date: 07/11/2022 CLINICAL DATA:  CP EXAM: CHEST - 2 VIEW COMPARISON:  CXR 11/22/21 FINDINGS: The heart size and mediastinal contours are within normal limits. Both lungs are clear. The visualized skeletal structures are unremarkable. IMPRESSION: No active pulmonary process. Electronically Signed   By: Lorenza Cambridge M.D.   On: 07/11/2022 10:48    Pertinent labs & imaging results that were available during my care of the patient were reviewed by me and considered in my medical decision making (see MDM for details).  Medications Ordered in ED Medications  alum & mag hydroxide-simeth (MAALOX/MYLANTA) 200-200-20 MG/5ML suspension 30 mL (30 mLs Oral Given 07/11/22 1104)    And  lidocaine (XYLOCAINE) 2 % viscous mouth solution 15 mL (15 mLs Oral Given 07/11/22 1104)  Procedures Procedures  (including critical care time)  Medical Decision Making / ED Course   MDM:  32 year old male presenting to the emergency department with chest/abdominal pain.  Patient very well-appearing, physical exam with no abdominal tenderness or chest tenderness.  Vital signs reassuring.  On further history, pain seems more epigastric than chest pain.  Suspect likely gastritis or reflux as the patient reports he takes ibuprofen daily.  Doubt pancreatitis without tenderness or radiation of the pain.  He reports 1 episode of blood in his vomit, but no further episodes, denies black stool, hemoglobin is reassuring and normal with reassuring vital  signs.  Extremely low concern for large volume blood loss.  Given age, doubt ACS, EKG reassuring, troponin was sent by triage nurse and if negative extreme low concern for ACS with multiple days of symptoms.  Doubt PE, patient PERC negative.  Doubt pneumonia or pneumothorax but will check chest x-ray.  Will give GI cocktail for suspected gastritis/GERD and reassess.  Instructed patient not to take Advil PM daily and if he needs help with sleep he could take Benadryl or melatonin.  Clinical Course as of 07/11/22 1253  Mon Jul 11, 2022  1245 Patient reports he feels better after receiving GI cocktail.  Initial troponin obtained as part of a triage protocol was minimally elevated in a nonspecific manner.  Rechecked this and it is normal.  Extremely low concern for any underlying cardiac process.  Advised patient he should stop taking Advil PM and if he would like to take Benadryl for sleep he should take Benadryl on its own. Will discharge patient to home. All questions answered. Patient comfortable with plan of discharge. Return precautions discussed with patient and specified on the after visit summary.  [WS]    Clinical Course User Index [WS] Lonell Grandchild, MD     Additional history obtained:  -External records from outside source obtained and reviewed including: Chart review including previous notes, labs, imaging, consultation notes including prior ER visits for URI   Lab Tests: -I ordered, reviewed, and interpreted labs.   The pertinent results include:   Labs Reviewed  BASIC METABOLIC PANEL - Abnormal; Notable for the following components:      Result Value   Glucose, Bld 119 (*)    All other components within normal limits  TROPONIN I (HIGH SENSITIVITY) - Abnormal; Notable for the following components:   Troponin I (High Sensitivity) 20 (*)    All other components within normal limits  CBC  TROPONIN I (HIGH SENSITIVITY)    Notable for nonspecific borderline troponin  elevation  EKG   EKG Interpretation  Date/Time:  Monday July 11 2022 10:13:35 EDT Ventricular Rate:  50 PR Interval:  138 QRS Duration: 92 QT Interval:  426 QTC Calculation: 389 R Axis:   96 Text Interpretation: Sinus rhythm Borderline right axis deviation ST elev, probable normal early repol pattern No significant change since last tracing Confirmed by Alvino Blood ([id number]) on 07/11/2022 10:48:05 AM         Imaging Studies ordered: I ordered imaging studies including CXR On my interpretation imaging demonstrates no acute process I independently visualized and interpreted imaging. I agree with the radiologist interpretation   Medicines ordered and prescription drug management: Meds ordered this encounter  Medications   AND Linked Order Group    alum & mag hydroxide-simeth (MAALOX/MYLANTA) 200-200-20 MG/5ML suspension 30 mL    lidocaine (XYLOCAINE) 2 % viscous mouth solution 15 mL   diphenhydrAMINE (BENADRYL) 50 MG tablet  Sig: Take 1 tablet (50 mg total) by mouth at bedtime as needed for sleep.    Dispense:  30 tablet    Refill:  0   famotidine (PEPCID) 40 MG tablet    Sig: Take 1 tablet (40 mg total) by mouth daily.    Dispense:  30 tablet    Refill:  0   alum & mag hydroxide-simeth (MAALOX MAX) 400-400-40 MG/5ML suspension    Sig: Take 15 mLs by mouth every 6 (six) hours as needed for indigestion.    Dispense:  355 mL    Refill:  0    -I have reviewed the patients home medicines and have made adjustments as needed    Cardiac Monitoring: The patient was maintained on a cardiac monitor.  I personally viewed and interpreted the cardiac monitored which showed an underlying rhythm of: NSR  Social Determinants of Health:  Diagnosis or treatment significantly limited by social determinants of health: current smoker   Reevaluation: After the interventions noted above, I reevaluated the patient and found that their symptoms have resolved  Co morbidities that  complicate the patient evaluation  Past Medical History:  Diagnosis Date   Hypertension    Prediabetes       Dispostion: Disposition decision including need for hospitalization was considered, and patient discharged from emergency department.    Final Clinical Impression(s) / ED Diagnoses Final diagnoses:  Acute gastritis with hemorrhage, unspecified gastritis type     This chart was dictated using voice recognition software.  Despite best efforts to proofread,  errors can occur which can change the documentation meaning.    Lonell Grandchild, MD 07/11/22 1253

## 2022-07-11 NOTE — Discharge Instructions (Addendum)
We evaluated you for your vomiting and pain.  Your symptoms are most likely due to inflammation in your stomach.  This can be due to taking Advil (ibuprofen).  Since you are taking Advil PM for sleep, I prescribed you Benadryl, which is the "p.m." component of Advil PM.  Please do not drive or drink alcohol with Benadryl as it will make you very drowsy.

## 2022-07-11 NOTE — ED Triage Notes (Signed)
Pt arrives pov, steady gait, endorses generalized CP and hematemesis on Saturday. Mild CP today. Denies shob or cough

## 2022-12-27 ENCOUNTER — Emergency Department (HOSPITAL_BASED_OUTPATIENT_CLINIC_OR_DEPARTMENT_OTHER)
Admission: EM | Admit: 2022-12-27 | Discharge: 2022-12-27 | Disposition: A | Discharge status: Home / Self Care | Payer: BC Managed Care – PPO | Attending: Emergency Medicine | Admitting: Emergency Medicine

## 2022-12-27 ENCOUNTER — Encounter (HOSPITAL_BASED_OUTPATIENT_CLINIC_OR_DEPARTMENT_OTHER): Payer: Self-pay

## 2022-12-27 ENCOUNTER — Other Ambulatory Visit: Payer: Self-pay

## 2022-12-27 DIAGNOSIS — I10 Essential (primary) hypertension: Secondary | ICD-10-CM | POA: Diagnosis not present

## 2022-12-27 DIAGNOSIS — K529 Noninfective gastroenteritis and colitis, unspecified: Secondary | ICD-10-CM | POA: Diagnosis not present

## 2022-12-27 DIAGNOSIS — R112 Nausea with vomiting, unspecified: Secondary | ICD-10-CM | POA: Diagnosis present

## 2022-12-27 DIAGNOSIS — Z79899 Other long term (current) drug therapy: Secondary | ICD-10-CM | POA: Insufficient documentation

## 2022-12-27 DIAGNOSIS — D72829 Elevated white blood cell count, unspecified: Secondary | ICD-10-CM | POA: Insufficient documentation

## 2022-12-27 LAB — CBC
HCT: 47.7 % (ref 39.0–52.0)
Hemoglobin: 16 g/dL (ref 13.0–17.0)
MCH: 29.8 pg (ref 26.0–34.0)
MCHC: 33.5 g/dL (ref 30.0–36.0)
MCV: 88.8 fL (ref 80.0–100.0)
Platelets: 298 10*3/uL (ref 150–400)
RBC: 5.37 MIL/uL (ref 4.22–5.81)
RDW: 13.5 % (ref 11.5–15.5)
WBC: 18.7 10*3/uL — ABNORMAL HIGH (ref 4.0–10.5)
nRBC: 0 % (ref 0.0–0.2)

## 2022-12-27 LAB — URINALYSIS, MICROSCOPIC (REFLEX)

## 2022-12-27 LAB — COMPREHENSIVE METABOLIC PANEL
ALT: 38 U/L (ref 0–44)
AST: 31 U/L (ref 15–41)
Albumin: 4.4 g/dL (ref 3.5–5.0)
Alkaline Phosphatase: 54 U/L (ref 38–126)
Anion gap: 9 (ref 5–15)
BUN: 12 mg/dL (ref 6–20)
CO2: 24 mmol/L (ref 22–32)
Calcium: 8.8 mg/dL — ABNORMAL LOW (ref 8.9–10.3)
Chloride: 104 mmol/L (ref 98–111)
Creatinine, Ser: 0.98 mg/dL (ref 0.61–1.24)
GFR, Estimated: >60 mL/min (ref >60)
Glucose, Bld: 145 mg/dL — ABNORMAL HIGH (ref 70–99)
Potassium: 4.4 mmol/L (ref 3.5–5.1)
Sodium: 138 mmol/L (ref 135–145)
Total Bilirubin: 0.8 mg/dL (ref <1.2)
Total Protein: 7.4 g/dL (ref 6.5–8.1)

## 2022-12-27 LAB — URINALYSIS, ROUTINE W REFLEX MICROSCOPIC
Bilirubin Urine: NEGATIVE
Glucose, UA: NEGATIVE mg/dL
Ketones, ur: NEGATIVE mg/dL
Leukocytes,Ua: NEGATIVE
Nitrite: NEGATIVE
Protein, ur: NEGATIVE mg/dL
Specific Gravity, Urine: >=1.03 (ref 1.005–1.030)
pH: 6 (ref 5.0–8.0)

## 2022-12-27 LAB — LIPASE, BLOOD: Lipase: 23 U/L (ref 11–51)

## 2022-12-27 MED ORDER — ONDANSETRON 4 MG PO TBDP: 4.0000 mg | ORAL_TABLET | Freq: Three times a day (TID) | ORAL | 0 refills | Status: AC | PRN

## 2022-12-27 NOTE — Discharge Instructions (Addendum)
As discussed, your symptoms are most likely related to a viral gastroenteritis (stomach bug).  I have sent a prescription of Zofran to your pharmacy you can take this up to 3 times a day as needed for nausea and vomiting.  Make sure you are staying hydrated.  You can drink fluids with electrolytes like Powerade or Gatorade to make sure your electrolytes stay up.  There is a phone number in the discharge paperwork that you can call to help you to set up with a primary care provider.  Return to the ED if you are unable to hold down any food by mouth or your abdominal pain returns and worsens, you feel weak, or you pass out.

## 2022-12-27 NOTE — ED Provider Notes (Signed)
[place] [facility] Provider Note   CSN: [id number] Arrival date & time: 12/27/22  2022     History  Chief Complaint  Patient presents with   Emesis    Barry Lester is a 32 y.o. male with a history of prediabetes and hypertension who presents the ED today for emesis.  Patient reports that when he woke up around 3 PM this afternoon he started vomiting.  He reports about 6 episodes of nonbloody vomiting between then and 6:30 PM with associated epigastric pain and nonbloody diarrhea.  He denies fever.  He states that he took Advil PM without any relief.  He denies eating any new foods prior to onset of symptoms.  Patient went to the nurse at his job and she said that he probably has a stomach bug and that it has been going around his work.  Patient presents here for further evaluation.  At this time, patient reports that his abdominal pain has resolved and does not feel nauseous anymore.  No additional complaints or concerns at this time.    Home Medications Prior to Admission medications   Medication Sig Start Date End Date Taking? Authorizing Provider  alum & mag hydroxide-simeth (MAALOX MAX) 400-400-40 MG/5ML suspension Take 15 mLs by mouth every 6 (six) hours as needed for indigestion. 07/11/22   Lonell Grandchild, MD  dicyclomine (BENTYL) 20 MG tablet Take 1 tablet (20 mg total) by mouth 2 (two) times daily. 11/12/21   Marita Kansas, PA-C  diphenhydrAMINE (BENADRYL) 50 MG tablet Take 1 tablet (50 mg total) by mouth at bedtime as needed for sleep. 07/11/22   Lonell Grandchild, MD  famotidine (PEPCID) 40 MG tablet Take 1 tablet (40 mg total) by mouth daily. 07/11/22   Lonell Grandchild, MD  ondansetron (ZOFRAN-ODT) 4 MG disintegrating tablet Take 1 tablet (4 mg total) by mouth every 8 (eight) hours as needed for nausea or vomiting. 12/27/22 01/26/23  Maxwell Marion, PA-C      Allergies    Patient has no known allergies.    Review of Systems   Review  of Systems  Gastrointestinal:  Positive for vomiting.  All other systems reviewed and are negative.   Physical Exam Updated Vital Signs BP 120/84 (BP Location: Left Arm)   Pulse 89   Temp 97.6 F (36.4 C) (Oral)   Resp 20   Wt 66.2 kg   SpO2 100%   BMI 23.57 kg/m  Physical Exam Vitals and nursing note reviewed.  Constitutional:      General: He is not in acute distress.    Appearance: Normal appearance. He is not ill-appearing.  HENT:     Head: Normocephalic and atraumatic.     Mouth/Throat:     Mouth: Mucous membranes are moist.  Eyes:     Conjunctiva/sclera: Conjunctivae normal.     Pupils: Pupils are equal, round, and reactive to light.  Cardiovascular:     Rate and Rhythm: Normal rate and regular rhythm.     Pulses: Normal pulses.     Heart sounds: Normal heart sounds.  Pulmonary:     Effort: Pulmonary effort is normal.     Breath sounds: Normal breath sounds.  Abdominal:     Palpations: Abdomen is soft.     Tenderness: There is no abdominal tenderness.  Musculoskeletal:        General: Normal range of motion.     Cervical back: Normal range of motion.  Skin:  General: Skin is warm and dry.     Findings: No rash.  Neurological:     General: No focal deficit present.     Mental Status: He is alert.  Psychiatric:        Mood and Affect: Mood normal.        Behavior: Behavior normal.    ED Results / Procedures / Treatments   Labs (all labs ordered are listed, but only abnormal results are displayed) Labs Reviewed  COMPREHENSIVE METABOLIC PANEL - Abnormal; Notable for the following components:      Result Value   Glucose, Bld 145 (*)    Calcium 8.8 (*)    All other components within normal limits  CBC - Abnormal; Notable for the following components:   WBC 18.7 (*)    All other components within normal limits  URINALYSIS, ROUTINE W REFLEX MICROSCOPIC - Abnormal; Notable for the following components:   Hgb urine dipstick TRACE (*)    All other  components within normal limits  URINALYSIS, MICROSCOPIC (REFLEX) - Abnormal; Notable for the following components:   Bacteria, UA RARE (*)    All other components within normal limits  LIPASE, BLOOD    EKG None  Radiology No results found.  Procedures Procedures: not indicated.   Medications Ordered in ED Medications - No data to display  ED Course/ Medical Decision Making/ A&P                                 Medical Decision Making Amount and/or Complexity of Data Reviewed Labs: ordered.  Risk Prescription drug management.   This patient presents to the ED for concern of abdominal pain with N/V/D, this involves an extensive number of treatment options, and is a complaint that carries with it a high risk of complications and morbidity.   Differential diagnosis includes: Gastritis, gastroenteritis, IBS, IBD, pancreatitis, bowel obstruction, cholangitis, choledocholithiasis, cholecystitis, volvulus, perforation, colitis, etc.   Comorbidities  See HPI above   Additional History  Additional history obtained from prior records.   Lab Tests  I ordered and personally interpreted labs.  The pertinent results include:   Elevated white count of 18.7 CMP is within normal limits no acute electrolyte derangement, AKI, or transaminitis Negative lipase UA shows rare bacteria and trace hemoglobin the patient denies any urinary symptoms   Imaging Studies  Patient's abdominal pain resolved at the time of evaluation, therefore no imaging was ordered.   Problem List / ED Course / Critical Interventions / Medication Management  Abdominal pain, nausea, vomiting, and diarrhea Patient's abdominal pain resolved at the time of evaluation and he did not feel nauseous.  No medications were given.  Patient states that he was feeling hungry so we p.o. challenged him and was able to tolerate food and drink by mouth without vomiting or feeling nauseous. He had an elevated white  count of 18.7, with labs otherwise reassuring. He was hemodynamically stable.  No fever, tachycardia, or tachypnea.  Low suspicion for sepsis.  His symptoms resolved prior to coming to the ED and he has coworkers who have been sick with similar symptoms recently. I informed patient that I will send a prescription of Zofran to the pharmacy in case he starts to feel nauseous. I have reviewed the patients home medicines and have made adjustments as needed   Social Determinants of Health  Occupation   Test / Admission - Considered  Discussed findings with  patient.  All questions were answered. He is hemodynamically stable and safe for discharge home. Return precautions provided.       Final Clinical Impression(s) / ED Diagnoses Final diagnoses:  Nausea vomiting and diarrhea  Gastroenteritis    Rx / DC Orders ED Discharge Orders          Ordered    ondansetron (ZOFRAN-ODT) 4 MG disintegrating tablet  Every 8 hours PRN        12/27/22 2232              Maxwell Marion, PA-C 12/27/22 2244    Loetta Rough, MD 12/27/22 [phone]

## 2022-12-27 NOTE — ED Triage Notes (Signed)
Pt arrives with c/o vomiting since about 3pm today. Pt reports having ABD pain and diarrhea.

## 2024-05-09 IMAGING — DX DG HAND COMPLETE 3+V*R*
3 series · 3 of 3 positions shown · non-contrast
Comparison: None Available.

CLINICAL DATA: Fall with right hand pain.  Pain in the thumb.

EXAM:
RIGHT HAND - COMPLETE 3+ VIEW

[hand ap]
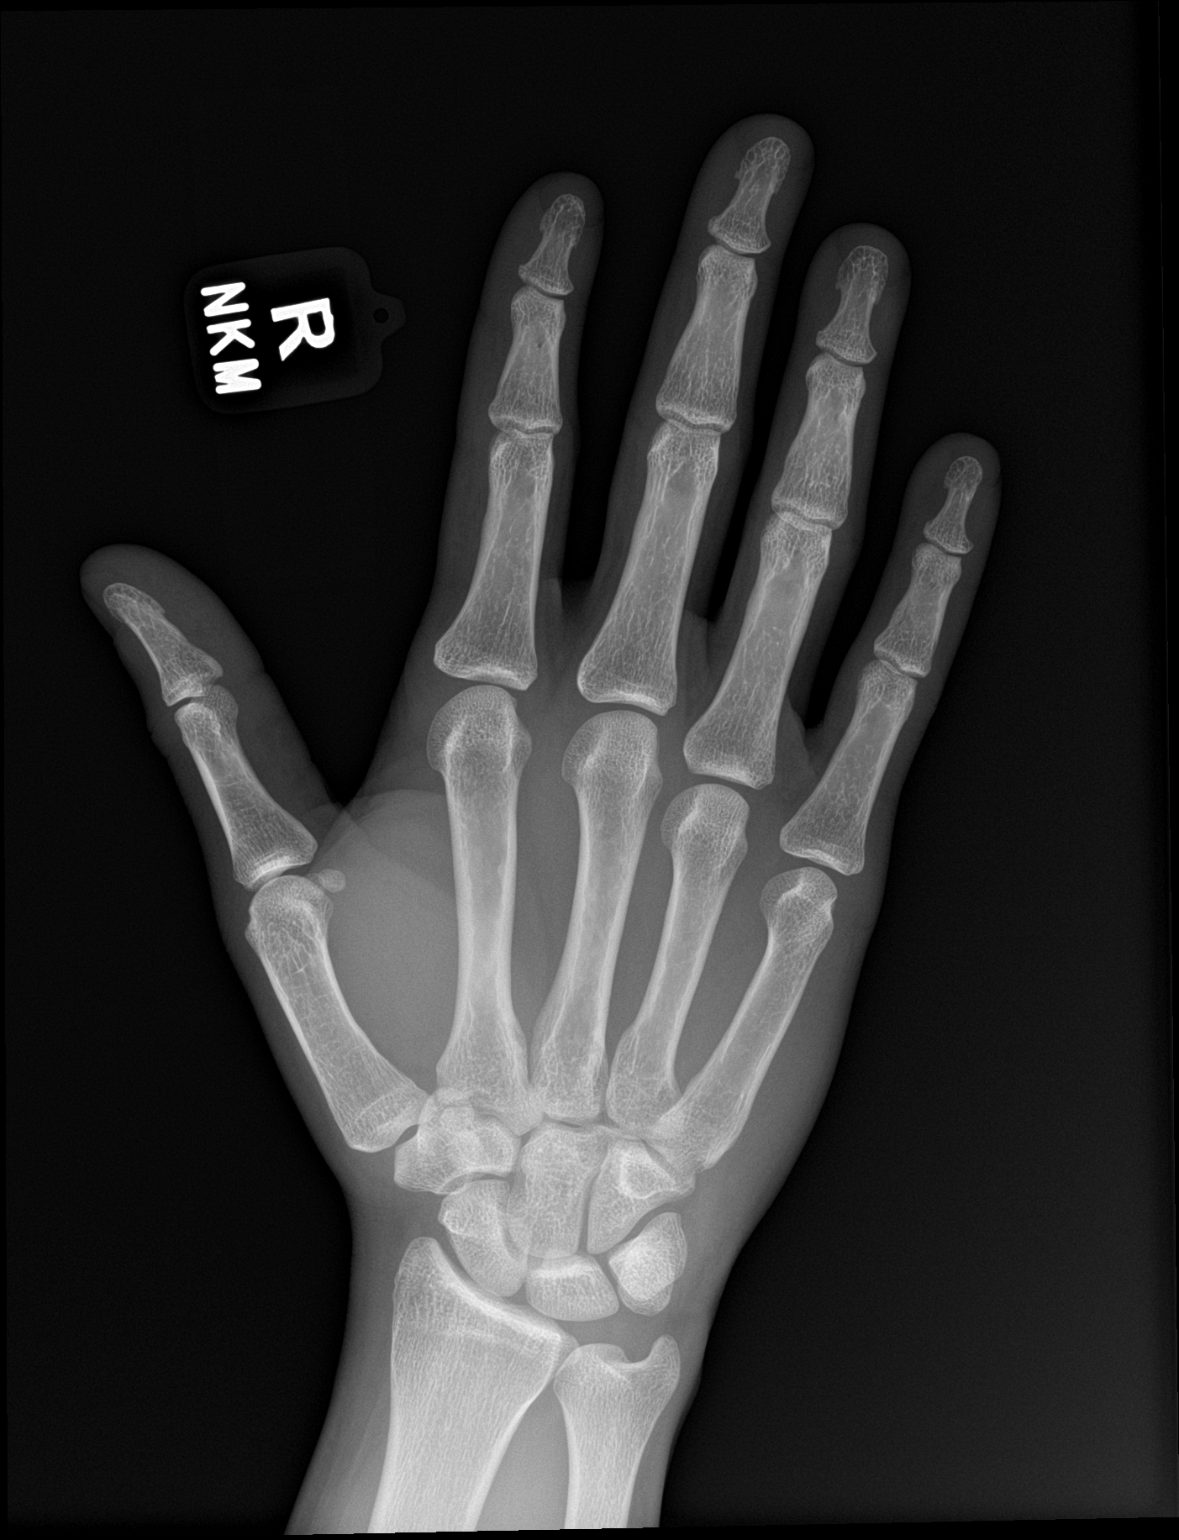

[hand obl]
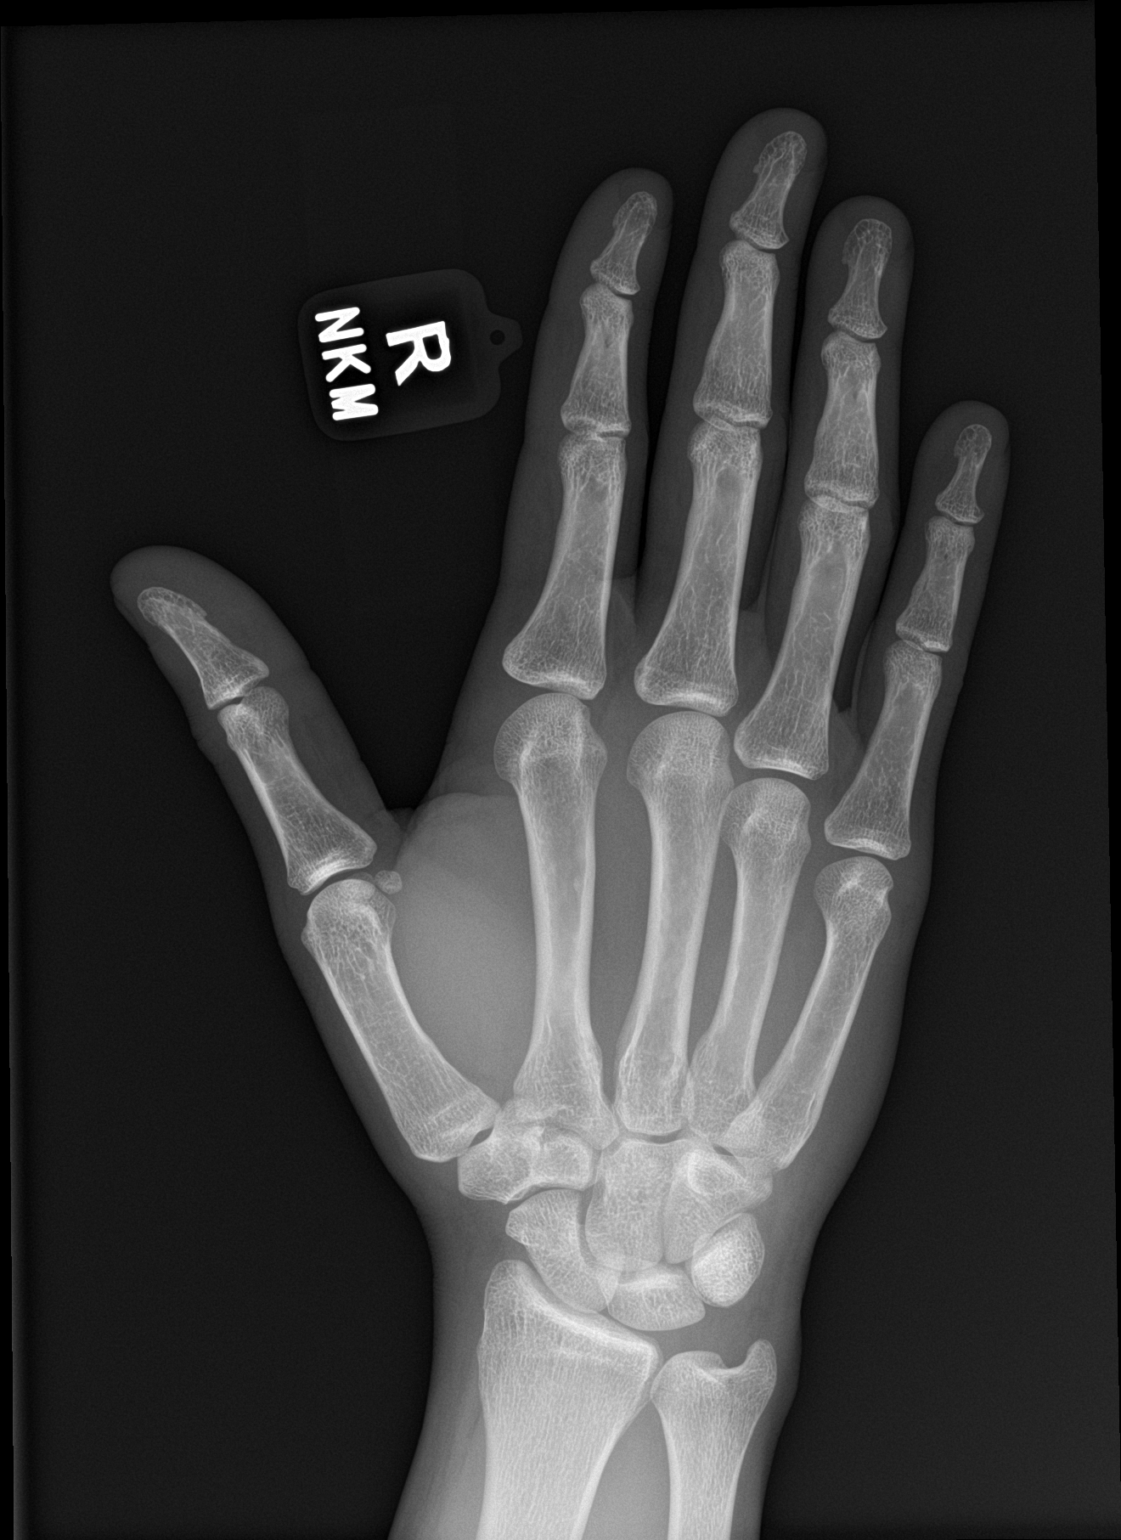

[hand lat]
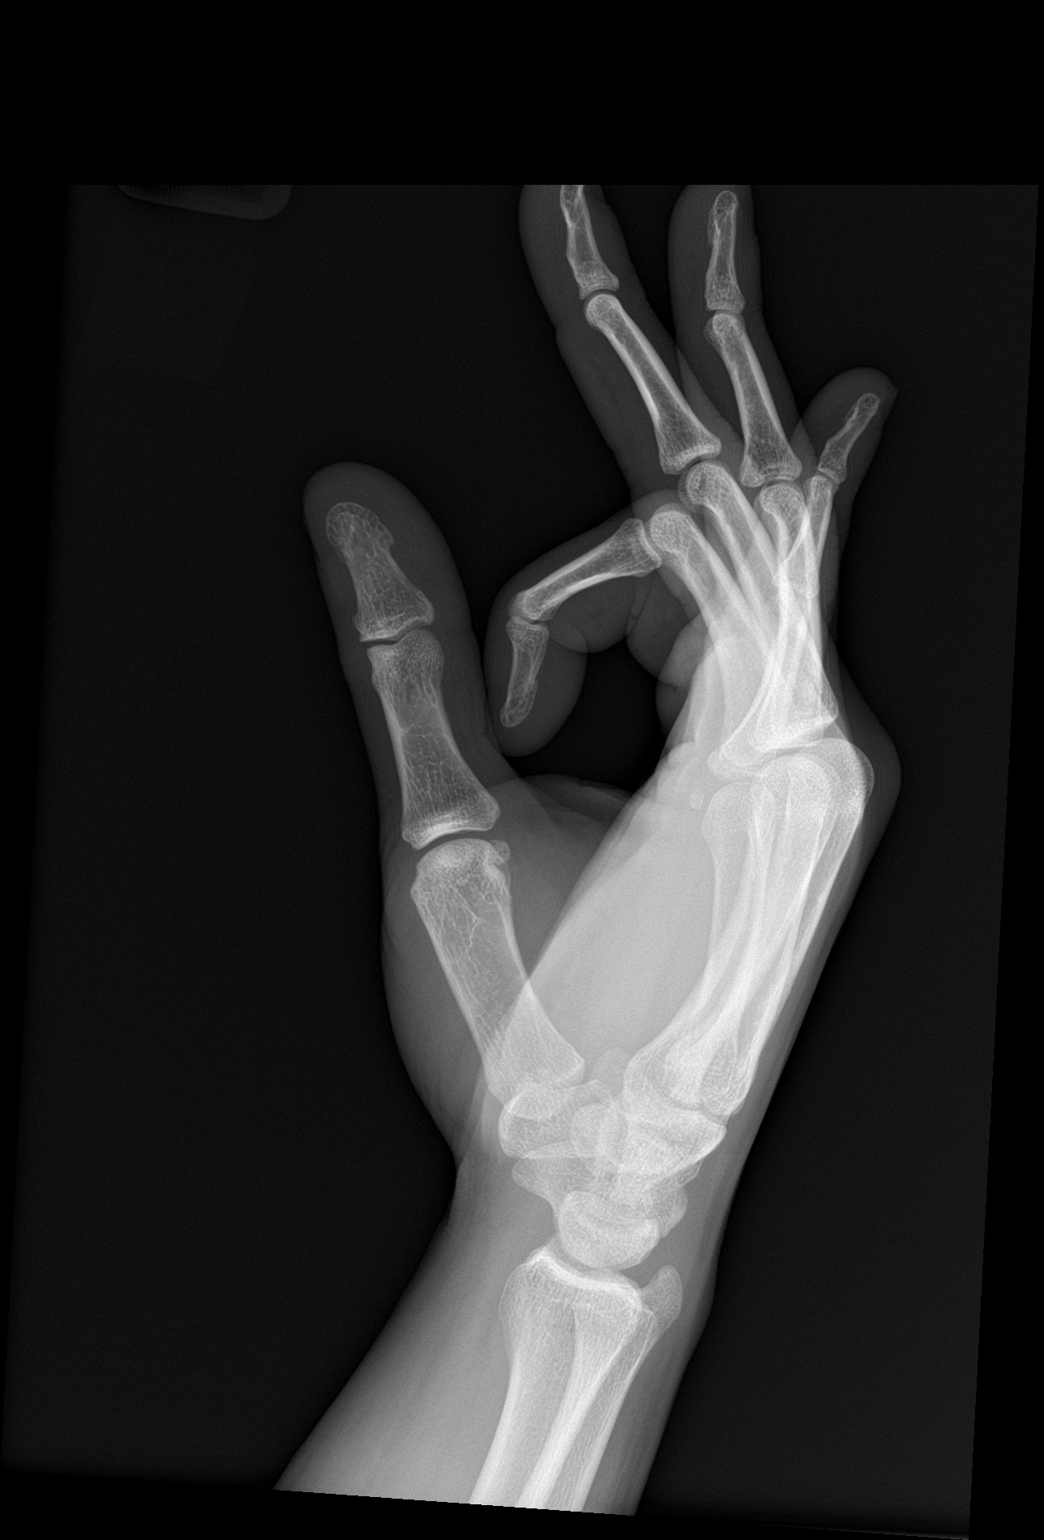

[3 of 3 positions shown; findings below may reference images not displayed]

FINDINGS: There is no evidence of fracture or dislocation. Particularly, no
fracture of the thumb. There is no evidence of arthropathy or other
focal bone abnormality. Soft tissues are unremarkable.
IMPRESSION: Negative radiographs of the right hand.
